# Patient Record
Sex: Male | Born: 1975
Health system: Southern US, Community
[De-identification: ages and names within clinical notes are randomized; demographics above are authoritative.]

## PROBLEM LIST (undated history)

## (undated) DIAGNOSIS — N200 Calculus of kidney: Secondary | ICD-10-CM

---

## 2001-10-18 ENCOUNTER — Inpatient Hospital Stay (HOSPITAL_COMMUNITY): Admission: EM | Admit: 2001-10-18 | Discharge: 2001-10-19 | Payer: Self-pay | Admitting: Psychiatry

## 2008-07-19 ENCOUNTER — Emergency Department (HOSPITAL_COMMUNITY): Admission: EM | Admit: 2008-07-19 | Discharge: 2008-07-19 | Payer: Self-pay | Admitting: Family Medicine

## 2009-04-13 ENCOUNTER — Encounter: Admission: RE | Admit: 2009-04-13 | Discharge: 2009-04-13 | Payer: Self-pay | Admitting: Family Medicine

## 2009-07-20 ENCOUNTER — Ambulatory Visit: Payer: Self-pay | Admitting: Family Medicine
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
## 2011-03-03 NOTE — Discharge Summary (Signed)
[facility]  Patient:    Ronald Wiggins, Ronald Wiggins Visit Number: [id number] MRN: [id number]          Service Type: PSY Location: 300 0301 02 Attending Physician:  Rachael Fee Dictated by:   Reymundo Poll Dub Mikes, M.D. Admit Date:  10/18/2001 Discharge Date: 10/19/2001                             Discharge Summary  CHIEF COMPLAINT AND PRESENT ILLNESS:  This was the first admission to [facility] for this 35 year old male, who was admitted due to increased depression as well as increased alcohol use.  He was admitted on January 3rd.  He used to see [facility] and Dr. Nolen Mu.  His primary physician was handling the medication.  He was given Effexor XR 150 mg per day.  He did admit to occasional use of alcohol, which built up for the last few days before the admission but denies any active use.  Upon admission, she showed to be a well-nourished, well-developed, alert, cooperative male, spontaneous, logical speech, coherent, relevant and goal directed.  His thoughts dealt with the events that led him to be hospitalized, his coping, his losses.  He is wanting to do well and be able to be there for his daughter.  No active suicidal ideation.  No homicidal ideation.  He, upon admission, wanted to be discharged home.  He would be willing to come to the IOP for further stabilization and take time off from work so he can get himself back together.  Cognition was well-preserved.  ADMISSION DIAGNOSES: Axis I:    1. Major depression, recurrent.            2. Panic attacks.            3. Rule out alcohol abuse. Axis II:   No diagnosis. Axis III:  No diagnosis. Axis IV:   Moderate. Axis V:    Global Assessment of Functioning upon admission 45; highest Global            Assessment of Functioning in the last year 65.  HOSPITAL COURSE:  We went ahead and increased the Effexor to 225 mg.  He was given Xanax 0.25 mg on a p.r.n. basis for the next few days  after discharge to transition to outpatient treatment.  He had no suicidal or homicidal ideation. He had a good support system outside of the hospital.  He wanted to be discharged so we went ahead and discharged to outpatient follow-up.  DISCHARGE DIAGNOSES: Axis I:    1. Major depression, recurrent.            2. Panic attacks. Axis II:   No diagnosis. Axis III:  No diagnosis. Axis IV:   Moderate. Axis V:    Global Assessment of Functioning upon discharge 65.  DISCHARGE MEDICATIONS: 1. Effexor XR 225 mg per day. 2. Xanax 0.25 mg twice a day (#14).  FOLLOW-UP:  [facility] and then follow up with Dr. Nolen Mu and [facility]. Dictated by:   Reymundo Poll Dub Mikes, M.D. Attending Physician:  Rachael Fee DD:  11/20/01 TD:  11/21/01 Job: [id number]

## 2011-04-20 ENCOUNTER — Encounter: Payer: Self-pay | Admitting: Family Medicine

## 2011-04-20 ENCOUNTER — Ambulatory Visit (INDEPENDENT_AMBULATORY_CARE_PROVIDER_SITE_OTHER): Payer: PRIVATE HEALTH INSURANCE | Admitting: Family Medicine

## 2011-04-20 VITALS — BP 130/90 | HR 76 | Wt 209.0 lb

## 2011-04-20 DIAGNOSIS — F411 Generalized anxiety disorder: Secondary | ICD-10-CM

## 2011-04-20 MED ORDER — ALPRAZOLAM 0.25 MG PO TABS: 0.2500 mg | ORAL_TABLET | Freq: Three times a day (TID) | ORAL | Status: AC | PRN

## 2011-04-20 NOTE — Patient Instructions (Addendum)
Uses Xanax as needed. Go ahead and experienced all thoughts and feelings you have then  deal with them rather than hold them in

## 2011-04-20 NOTE — Progress Notes (Signed)
  Subjective:    Patient ID: Ronald Wiggins, male    DOB: 09-27-1976, 35 y.o.   MRN: [id number]  HPI He is here for consultation. Since last being seen he did not start on the Cymbalta and did quite well off of it until the last month or so. In that timeframe he has noted increased anxiety, chest tightness, difficulty with sleep as well as occasionally crying. His work is going fairly well. The main issue is the demise of a several month relationship that occurred about the same time. He has had some difficulty with functioning at work and did take today off.   Review of Systems     Objective:   Physical Exam Alert and in no distress with appropriate affect quite tearful.       Assessment & Plan:  Situational anxiety. I will give him Xanax to help control his emotions since he is sometimes having difficulties with functioning at work. I explained that he is going through a grief reaction on top of all of his other life stresses. Discussed dealing with all of his emotions rather than burying them. He did seem to understand this. I will give him Xanax. He continues to have difficulty, further evaluation and counseling will probably be necessary.

## 2011-06-29 ENCOUNTER — Inpatient Hospital Stay (INDEPENDENT_AMBULATORY_CARE_PROVIDER_SITE_OTHER)
Admission: RE | Admit: 2011-06-29 | Discharge: 2011-06-29 | Disposition: A | Discharge status: Home / Self Care | Payer: PRIVATE HEALTH INSURANCE | Source: Ambulatory Visit | Attending: Family Medicine | Admitting: Family Medicine

## 2011-06-29 DIAGNOSIS — N453 Epididymo-orchitis: Secondary | ICD-10-CM

## 2011-06-30 LAB — GC/CHLAMYDIA PROBE AMP, URINE
Chlamydia, Swab/Urine, PCR: NEGATIVE
GC Probe Amp, Urine: NEGATIVE

## 2011-07-18 LAB — DIFFERENTIAL
Basophils Absolute: 0.1
Basophils Relative: 1
Eosinophils Relative: 3
Lymphs Abs: 2.4
Neutro Abs: 5.9
Neutrophils Relative %: 64

## 2011-07-18 LAB — CBC
HCT: 51.2
Hemoglobin: 17.9 — ABNORMAL HIGH
MCV: 94.2
RBC: 5.43
RDW: 14.1
WBC: 9.3

## 2011-07-18 LAB — POCT I-STAT, CHEM 8
BUN: 5 — ABNORMAL LOW
Calcium, Ion: 1.16
Glucose, Bld: 95

## 2013-06-20 ENCOUNTER — Encounter: Payer: Self-pay | Admitting: Family Medicine

## 2013-06-20 ENCOUNTER — Ambulatory Visit (INDEPENDENT_AMBULATORY_CARE_PROVIDER_SITE_OTHER): Payer: PRIVATE HEALTH INSURANCE | Admitting: Family Medicine

## 2013-06-20 VITALS — BP 130/80 | HR 93 | Temp 98.8°F | Wt 250.0 lb

## 2013-06-20 DIAGNOSIS — J029 Acute pharyngitis, unspecified: Secondary | ICD-10-CM

## 2013-06-20 LAB — POCT RAPID STREP A (OFFICE): Rapid Strep A Screen: NEGATIVE

## 2013-06-20 NOTE — Patient Instructions (Signed)
Viral Pharyngitis Viral pharyngitis is a viral infection that produces redness, pain, and swelling (inflammation) of the throat. It can spread from person to person (contagious). CAUSES Viral pharyngitis is caused by inhaling a large amount of certain germs called viruses. Many different viruses cause viral pharyngitis. SYMPTOMS Symptoms of viral pharyngitis include:  Sore throat.  Tiredness.  Stuffy nose.  Low-grade fever.  Congestion.  Cough. TREATMENT Treatment includes rest, drinking plenty of fluids, and the use of over-the-counter medication (approved by your caregiver). HOME CARE INSTRUCTIONS   Drink enough fluids to keep your urine clear or pale yellow.  Eat soft, cold foods such as ice cream, frozen ice pops, or gelatin dessert.  Gargle with warm salt water (1 tsp salt per 1 qt of water).  If over age 7, throat lozenges may be used safely.  Only take over-the-counter or prescription medicines for pain, discomfort, or fever as directed by your caregiver. Do not take aspirin. To help prevent spreading viral pharyngitis to others, avoid:  Mouth-to-mouth contact with others.  Sharing utensils for eating and drinking.  Coughing around others. SEEK MEDICAL CARE IF:   You are better in a few days, then become worse.  You have a fever or pain not helped by pain medicines.  There are any other changes that concern you. Document Released: 07/12/2005 Document Revised: 12/25/2011 Document Reviewed: 12/08/2010 ExitCare Patient Information 2014 [employer].  

## 2013-06-20 NOTE — Progress Notes (Signed)
  Subjective:    Patient ID: Ronald Wiggins, male    DOB: 06/13/1976, 37 y.o.   MRN: [id number]  HPI He has a three-day history of sore throat, dry cough but no fever, chills or earache  Review of Systems     Objective:   Physical Exam alert and in no distress. Tympanic membranes and canals are normal. Throat is clear. Tonsils are normal. Neck is supple without adenopathy or thyromegaly. Cardiac exam shows a regular sinus rhythm without murmurs or gallops. Lungs are clear to auscultation.        Assessment & Plan:

## 2013-12-25 ENCOUNTER — Encounter: Payer: Self-pay | Admitting: Family Medicine

## 2013-12-25 ENCOUNTER — Telehealth: Payer: Self-pay | Admitting: Internal Medicine

## 2013-12-25 ENCOUNTER — Ambulatory Visit (INDEPENDENT_AMBULATORY_CARE_PROVIDER_SITE_OTHER): Payer: PRIVATE HEALTH INSURANCE | Admitting: Family Medicine

## 2013-12-25 VITALS — BP 130/94 | HR 80 | Temp 98.5°F | Wt 254.0 lb

## 2013-12-25 DIAGNOSIS — J019 Acute sinusitis, unspecified: Secondary | ICD-10-CM

## 2013-12-25 MED ORDER — AMOXICILLIN 875 MG PO TABS: 875.0000 mg | ORAL_TABLET | Freq: Two times a day (BID) | ORAL | Status: DC

## 2013-12-25 NOTE — Telephone Encounter (Signed)
Pt coming in today at 2:15

## 2013-12-25 NOTE — Progress Notes (Signed)
   Subjective:    Patient ID: Ronald Wiggins, male    DOB: [birth year], 38 y.o.   MRN: [id number]  HPI He complains of a 7 day history this started with nasal congestion, sore throat, sneezing, watery eyes. He has been on Claritin for this. In the last several days the symptoms have worsened with associated sinus pressure especially on the left with headache and upper tooth discomfort with fatigue malaise and some chest tightness. No fever, chills, ear pressure. He does not smoke.   Review of Systems     Objective:   Physical Exam alert and in no distress. Tympanic membranes and canals are normal. Throat is clear. Tonsils are normal. Neck is supple without adenopathy or thyromegaly. Cardiac exam shows a regular sinus rhythm without murmurs or gallops. Lungs are clear to auscultation. He does have tenderness palpation over frontal and maxillary sinuses left greater than right        Assessment & Plan:  Acute sinusitis - Plan: amoxicillin (AMOXIL) 875 MG tablet  is to call if not entirely better when he finishes the course of the antibiotic.

## 2013-12-25 NOTE — Telephone Encounter (Signed)
Let him know that he will need an appointment selected determine the best course of therapy for him

## 2013-12-25 NOTE — Telephone Encounter (Signed)
Pt states that he is having sinus issues, his mucous is green/yellow and he is congested, has a headache and has ear popping. [facility] would [facility]

## 2013-12-25 NOTE — Patient Instructions (Addendum)
Take all the antibiotic and if not totally back to normal when you finish call me Add Afrin but only at night and continue on the Advil cold and sinus

## 2013-12-26 ENCOUNTER — Ambulatory Visit: Payer: PRIVATE HEALTH INSURANCE | Admitting: Family Medicine

## 2014-07-14 ENCOUNTER — Encounter (HOSPITAL_COMMUNITY): Payer: Self-pay | Admitting: Emergency Medicine

## 2014-07-14 ENCOUNTER — Emergency Department (HOSPITAL_COMMUNITY)
Admission: EM | Admit: 2014-07-14 | Discharge: 2014-07-14 | Discharge status: Against Medical Advice | Payer: No Typology Code available for payment source | Attending: Emergency Medicine | Admitting: Emergency Medicine

## 2014-07-14 ENCOUNTER — Emergency Department (HOSPITAL_COMMUNITY): Payer: No Typology Code available for payment source

## 2014-07-14 DIAGNOSIS — Z87891 Personal history of nicotine dependence: Secondary | ICD-10-CM | POA: Diagnosis not present

## 2014-07-14 DIAGNOSIS — R109 Unspecified abdominal pain: Secondary | ICD-10-CM | POA: Diagnosis present

## 2014-07-14 DIAGNOSIS — E119 Type 2 diabetes mellitus without complications: Secondary | ICD-10-CM

## 2014-07-14 DIAGNOSIS — R739 Hyperglycemia, unspecified: Secondary | ICD-10-CM

## 2014-07-14 DIAGNOSIS — Z87442 Personal history of urinary calculi: Secondary | ICD-10-CM | POA: Insufficient documentation

## 2014-07-14 DIAGNOSIS — N509 Disorder of male genital organs, unspecified: Secondary | ICD-10-CM | POA: Insufficient documentation

## 2014-07-14 HISTORY — DX: Calculus of kidney: N20.0

## 2014-07-14 LAB — CBG MONITORING, ED: GLUCOSE-CAPILLARY: 272 mg/dL — AB (ref 70–99)

## 2014-07-14 LAB — URINALYSIS, ROUTINE W REFLEX MICROSCOPIC
Bilirubin Urine: NEGATIVE
Glucose, UA: >1000 mg/dL — AB
Hgb urine dipstick: NEGATIVE
Ketones, ur: NEGATIVE mg/dL
Leukocytes, UA: NEGATIVE
NITRITE: NEGATIVE
Protein, ur: NEGATIVE mg/dL
Specific Gravity, Urine: 1.046 — ABNORMAL HIGH (ref 1.005–1.030)
UROBILINOGEN UA: 0.2 mg/dL (ref 0.0–1.0)
pH: 5.5 (ref 5.0–8.0)

## 2014-07-14 LAB — COMPREHENSIVE METABOLIC PANEL
ALK PHOS: 75 U/L (ref 39–117)
ALT: 39 U/L (ref 0–53)
AST: 36 U/L (ref 0–37)
Albumin: 4.3 g/dL (ref 3.5–5.2)
Anion gap: 17 — ABNORMAL HIGH (ref 5–15)
BILIRUBIN TOTAL: 1 mg/dL (ref 0.3–1.2)
BUN: 10 mg/dL (ref 6–23)
CHLORIDE: 97 meq/L (ref 96–112)
CO2: 21 mEq/L (ref 19–32)
CREATININE: 0.49 mg/dL — AB (ref 0.50–1.35)
Calcium: 9.3 mg/dL (ref 8.4–10.5)
GLUCOSE: 355 mg/dL — AB (ref 70–99)
Potassium: 4.5 mEq/L (ref 3.7–5.3)
Sodium: 135 mEq/L — ABNORMAL LOW (ref 137–147)
Total Protein: 7.8 g/dL (ref 6.0–8.3)

## 2014-07-14 LAB — CBC
HCT: 46.1 % (ref 39.0–52.0)
HEMOGLOBIN: 16.1 g/dL (ref 13.0–17.0)
MCH: 30.3 pg (ref 26.0–34.0)
MCHC: 34.9 g/dL (ref 30.0–36.0)
MCV: 86.7 fL (ref 78.0–100.0)
Platelets: 133 10*3/uL — ABNORMAL LOW (ref 150–400)
RBC: 5.32 MIL/uL (ref 4.22–5.81)
RDW: 13.1 % (ref 11.5–15.5)
WBC: 6.2 10*3/uL (ref 4.0–10.5)

## 2014-07-14 LAB — URINE MICROSCOPIC-ADD ON

## 2014-07-14 MED ORDER — SODIUM CHLORIDE 0.9 % IV BOLUS (SEPSIS)
1000.0000 mL | Freq: Once | INTRAVENOUS | Status: AC
  Administered 2014-07-14: 1000 mL via INTRAVENOUS

## 2014-07-14 MED ORDER — MORPHINE SULFATE 4 MG/ML IJ SOLN
4.0000 mg | Freq: Once | INTRAMUSCULAR | Status: AC
  Administered 2014-07-14: 4 mg via INTRAVENOUS
  Filled 2014-07-14: qty 1

## 2014-07-14 MED ORDER — KETOROLAC TROMETHAMINE 30 MG/ML IJ SOLN
30.0000 mg | Freq: Once | INTRAMUSCULAR | Status: AC
  Administered 2014-07-14: 30 mg via INTRAVENOUS
  Filled 2014-07-14: qty 1

## 2014-07-14 MED ORDER — HYDROCODONE-ACETAMINOPHEN 5-325 MG PO TABS: 1.0000 | ORAL_TABLET | Freq: Four times a day (QID) | ORAL | Status: DC | PRN

## 2014-07-14 MED ORDER — TAMSULOSIN HCL 0.4 MG PO CAPS
0.4000 mg | ORAL_CAPSULE | Freq: Once | ORAL | Status: AC
  Administered 2014-07-14: 0.4 mg via ORAL
  Filled 2014-07-14: qty 1

## 2014-07-14 NOTE — ED Notes (Signed)
Patient has refused blood draw for labs.RN made aware 

## 2014-07-14 NOTE — Discharge Instructions (Signed)
Diabetes Mellitus and Food °It is important for you to manage your blood sugar (glucose) level. Your blood glucose level can be greatly affected by what you eat. Eating healthier foods in the appropriate amounts throughout the day at about the same time each day will help you control your blood glucose level. It can also help slow or prevent worsening of your diabetes mellitus. Healthy eating may even help you improve the level of your blood pressure and reach or maintain a healthy weight.  °HOW CAN FOOD AFFECT ME? °Carbohydrates °Carbohydrates affect your blood glucose level more than any other type of food. Your dietitian will help you determine how many carbohydrates to eat at each meal and teach you how to count carbohydrates. Counting carbohydrates is important to keep your blood glucose at a healthy level, especially if you are using insulin or taking certain medicines for diabetes mellitus. °Alcohol °Alcohol can cause sudden decreases in blood glucose (hypoglycemia), especially if you use insulin or take certain medicines for diabetes mellitus. Hypoglycemia can be a life-threatening condition. Symptoms of hypoglycemia (sleepiness, dizziness, and disorientation) are similar to symptoms of having too much alcohol.  °If your health care provider has given you approval to drink alcohol, do so in moderation and use the following guidelines: °· Women should not have more than one drink per day, and men should not have more than two drinks per day. One drink is equal to: °¨ 12 oz of beer. °¨ 5 oz of wine. °¨ 1½ oz of hard liquor. °· Do not drink on an empty stomach. °· Keep yourself hydrated. Have water, diet soda, or unsweetened iced tea. °· Regular soda, juice, and other mixers might contain a lot of carbohydrates and should be counted. °WHAT FOODS ARE NOT RECOMMENDED? °As you make food choices, it is important to remember that all foods are not the same. Some foods have fewer nutrients per serving than other  foods, even though they might have the same number of calories or carbohydrates. It is difficult to get your body what it needs when you eat foods with fewer nutrients. Examples of foods that you should avoid that are high in calories and carbohydrates but low in nutrients include: °· Trans fats (most processed foods list trans fats on the Nutrition Facts label). °· Regular soda. °· Juice. °· Candy. °· Sweets, such as cake, pie, doughnuts, and cookies. °· Fried foods. °WHAT FOODS CAN I EAT? °Have nutrient-rich foods, which will nourish your body and keep you healthy. The food you should eat also will depend on several factors, including: °· The calories you need. °· The medicines you take. °· Your weight. °· Your blood glucose level. °· Your blood pressure level. °· Your cholesterol level. °You also should eat a variety of foods, including: °· Protein, such as meat, poultry, fish, tofu, nuts, and seeds (lean animal proteins are best). °· Fruits. °· Vegetables. °· Dairy products, such as milk, cheese, and yogurt (low fat is best). °· Breads, grains, pasta, cereal, rice, and beans. °· Fats such as olive oil, trans fat-free margarine, canola oil, avocado, and olives. °DOES EVERYONE WITH DIABETES MELLITUS HAVE THE SAME MEAL PLAN? °Because every person with diabetes mellitus is different, there is not one meal plan that works for everyone. It is very important that you meet with a dietitian who will help you create a meal plan that is just right for you. °Document Released: 06/29/2005 Document Revised: 10/07/2013 Document Reviewed: 08/29/2013 °ExitCare® Patient Information ©2015 [employer]. This   information is not intended to replace advice given to you by your health care provider. Make sure you discuss any questions you have with your health care provider.   Flank Pain Flank pain refers to pain that is located on the side of the body between the upper abdomen and the back. The pain may occur over a short period  of time (acute) or may be long-term or reoccurring (chronic). It may be mild or severe. Flank pain can be caused by many things. CAUSES  Some of the more common causes of flank pain include:  Muscle strains.   Muscle spasms.   A disease of your spine (vertebral disk disease).   A lung infection (pneumonia).   Fluid around your lungs (pulmonary edema).   A kidney infection.   Kidney stones.   A very painful skin rash caused by the chickenpox virus (shingles).   Gallbladder disease.  HOME CARE INSTRUCTIONS  Home care will depend on the cause of your pain. In general,  Rest as directed by your caregiver.  Drink enough fluids to keep your urine clear or pale yellow.  Only take over-the-counter or prescription medicines as directed by your caregiver. Some medicines may help relieve the pain.  Tell your caregiver about any changes in your pain.  Follow up with your caregiver as directed. SEEK IMMEDIATE MEDICAL CARE IF:   Your pain is not controlled with medicine.   You have new or worsening symptoms.  Your pain increases.   You have abdominal pain.   You have shortness of breath.   You have persistent nausea or vomiting.   You have swelling in your abdomen.   You feel faint or pass out.   You have blood in your urine.  You have a fever or persistent symptoms for more than 2-3 days.  You have a fever and your symptoms suddenly get worse. MAKE SURE YOU:   Understand these instructions.  Will watch your condition.  Will get help right away if you are not doing well or get worse. Document Released: 11/23/2005 Document Revised: 06/26/2012 Document Reviewed: 05/16/2012 [facility] Patient Information 2015 [place], [place]. This information is not intended to replace advice given to you by your health care provider. Make sure you discuss any questions you have with your health care provider.

## 2014-07-14 NOTE — ED Notes (Signed)
Pt c/o flank pain radiating to left groin, onset this morning. Pt states has history of kidney stones and believes it is a kidney stone. Pt denies any urinary problems. Tender to LLQ upon assessment. Denies n/v/d or shortness of breath.

## 2014-07-14 NOTE — ED Notes (Signed)
Patient c/o left flank pain tht is radiating into the left groin area. Patient has a history of kidney stones. Patient denies any blood in his urine.

## 2014-07-14 NOTE — ED Provider Notes (Signed)
CSN: [id number]     Arrival date & time 07/14/14  1419 History   First MD Initiated Contact with Patient 07/14/14 1543     Chief Complaint  Patient presents with  . Flank Pain     (Consider location/radiation/quality/duration/timing/severity/associated sxs/prior Treatment) Patient is a 38 y.o. male presenting with flank pain. The history is provided by the patient. No language interpreter was used.  Flank Pain This is a new problem. The current episode started 6 to 12 hours ago. The problem occurs constantly. Progression since onset: waxing/waning. Associated symptoms include abdominal pain. Pertinent negatives include no chest pain, no headaches and no shortness of breath. Associated symptoms comments: L testicular pain. Nothing aggravates the symptoms. Nothing relieves the symptoms. He has tried nothing for the symptoms. The treatment provided no relief.    Past Medical History  Diagnosis Date  . Kidney stones    History reviewed. No pertinent past surgical history. Family History  Problem Relation Age of Onset  . Urolithiasis Sister   . Urolithiasis Brother    History  Substance Use Topics  . Smoking status: Former Smoker -- 0.10 packs/day    Types: Cigarettes  . Smokeless tobacco: Never Used  . Alcohol Use: Yes     Comment: occasionally    Review of Systems  Constitutional: Negative for fever, activity change, appetite change and fatigue.  HENT: Negative for congestion, facial swelling, rhinorrhea and trouble swallowing.   Eyes: Negative for photophobia and pain.  Respiratory: Negative for cough, chest tightness and shortness of breath.   Cardiovascular: Negative for chest pain and leg swelling.  Gastrointestinal: Positive for abdominal pain. Negative for nausea, vomiting, diarrhea and constipation.  Endocrine: Negative for polydipsia and polyuria.  Genitourinary: Positive for flank pain and testicular pain. Negative for dysuria, urgency, frequency, hematuria, decreased  urine volume, penile swelling, scrotal swelling, difficulty urinating and penile pain.  Musculoskeletal: Negative for back pain and gait problem.  Skin: Negative for color change, rash and wound.  Allergic/Immunologic: Negative for immunocompromised state.  Neurological: Negative for dizziness, facial asymmetry, speech difficulty, weakness, numbness and headaches.  Psychiatric/Behavioral: Negative for confusion, decreased concentration and agitation.      Allergies  Review of patient's allergies indicates no known allergies.  Home Medications   Prior to Admission medications   Medication Sig Start Date End Date Taking? Authorizing Provider  naproxen sodium (ANAPROX) 220 MG tablet Take 440 mg by mouth every 8 (eight) hours as needed (pain).   Yes Historical Provider, MD  HYDROcodone-acetaminophen (NORCO) 5-325 MG per tablet Take 1-2 tablets by mouth every 6 (six) hours as needed. 07/14/14   Toy CookeyMegan Ranae Casebier, MD   BP 142/81  Pulse 86  Temp(Src) 98.9 F (37.2 C) (Oral)  Resp 16  SpO2 98% Physical Exam  Constitutional: He is oriented to person, place, and time. He appears well-developed and well-nourished. No distress.  HENT:  Head: Normocephalic and atraumatic.  Mouth/Throat: No oropharyngeal exudate.  Eyes: Pupils are equal, round, and reactive to light.  Neck: Normal range of motion. Neck supple.  Cardiovascular: Normal rate, regular rhythm and normal heart sounds.  Exam reveals no gallop and no friction rub.   No murmur heard. Pulmonary/Chest: Effort normal and breath sounds normal. No respiratory distress. He has no wheezes. He has no rales.  Abdominal: Soft. Bowel sounds are normal. He exhibits no distension and no mass. There is no tenderness. There is no rebound and no guarding. Hernia confirmed negative in the right inguinal area and confirmed negative in the  left inguinal area.  Genitourinary: Testes normal. Right testis shows no mass, no swelling and no tenderness. Right  testis is descended. Cremasteric reflex is not absent on the right side. Left testis shows no mass, no swelling and no tenderness. Left testis is descended. Cremasteric reflex is not absent on the left side. Circumcised.  Musculoskeletal: Normal range of motion. He exhibits no edema and no tenderness.  Neurological: He is alert and oriented to person, place, and time.  Skin: Skin is warm and dry.  Psychiatric: He has a normal mood and affect.    ED Course  Procedures (including critical care time) Labs Review Labs Reviewed  CBC - Abnormal; Notable for the following:    Platelets 133 (*)    All other components within normal limits  URINALYSIS, ROUTINE W REFLEX MICROSCOPIC - Abnormal; Notable for the following:    Specific Gravity, Urine 1.046 (*)    Glucose, UA >1000 (*)    All other components within normal limits  COMPREHENSIVE METABOLIC PANEL - Abnormal; Notable for the following:    Sodium 135 (*)    Glucose, Bld 355 (*)    Creatinine, Ser 0.49 (*)    Anion gap 17 (*)    All other components within normal limits  CBG MONITORING, ED - Abnormal; Notable for the following:    Glucose-Capillary 272 (*)    All other components within normal limits  URINE MICROSCOPIC-ADD ON    Imaging Review Ct Renal Stone Study  07/14/2014   CLINICAL DATA:  Left flank pain.  History kidney stones.  EXAM: CT ABDOMEN AND PELVIS WITHOUT CONTRAST  TECHNIQUE: Multidetector CT imaging of the abdomen and pelvis was performed following the standard protocol without IV contrast.  COMPARISON:  08/01/2011  FINDINGS: Mild respiratory motion artifact in the upper abdomen.  Lower chest: Clear lung bases. Normal heart size, without pericardial or pleural effusion.  Hepatobiliary: Mild hepatic steatosis with sparing adjacent the gallbladder. Hepatomegaly, 21.9 cm craniocaudal. Normal gallbladder, without biliary ductal dilatation.  Pancreas: Normal, without mass or pancreatic ductal dilatation.  Spleen: Splenomegaly,  with the spleen measuring 13.1 x 13.6 x 9.3 cm. Mild.  Adrenals/Urinary Tract: Normal adrenal glands. Favor concentrated urine in the right upper pole renal medulla on image 30. Punctate stone felt less likely. No left-sided renal calculi. No hydronephrosis. No hydroureter or ureteric calculi. No bladder calculi.  Stomach/Bowel: Normal stomach, without wall thickening. Colonic stool burden suggests constipation. Normal terminal ileum and appendix. Normal small bowel without abdominal ascites.  Vascular/Lymphatic: No aneurysm. No abdominopelvic adenopathy.  Reproductive: Normal prostate. Small left hydrocele, incompletely imaged.  Other:  No significant free fluid.  Musculoskeletal: Congenitally short lumbar pedicles. Disc bulge at the lumbosacral junction.  IMPRESSION: 1. No urinary tract obstruction or ureteric stone. No urinary tract explanation for left-sided pain. 2. Favor concentrated urine in the upper pole right kidney. Punctate collecting system stone cannot be excluded. 3. Hepatic steatosis and hepatosplenomegaly. 4.  Possible constipation.   Electronically Signed   By: Jeronimo Greaves M.D.   On: 07/14/2014 18:47     EKG Interpretation None      MDM   Final diagnoses:  Left flank pain  Hyperglycemia  New onset type 2 diabetes mellitus    Pt is a 38 y.o. male with Pmhx as above who presents with sudden onset L flank pain at about 7:30 AM with radiation to L testicle. Pain waxing/waning. No fever, chills, nausea, vomiting, dysuria or hematuria. This is similar to prior kidney stones including  radiation to groin. He's never had to have surgical intervention for kidneys and in the past. On physical exam vital signs are stable he is in no acute distress. He has left CVA tenderness. GU exam is unremarkable and he is no reproducible tenderness to left testicle. I believe presentation is c/w kidney stone. Doubt testicular torsion given PE  Urinalysis normal and without blood but did have large amount  of glucose. CBC with glucose of 355, anion gap of 17. WBC normal. Creatinine normal. CT stone study added which showed no acute left-sided findings. He does have hepatic steatosis and hepatosplenomegaly. 2 L of normal saline were given and his glucose was repeated at 272. I discussed with them this elevated glucose; surgeon that he likely has new onset diabetes. Plan was to repeat BMP to look for improvement of his anion gap and glucose. He does not want to stay for this. I have explained to him that if these labs are abnormal he could have DKA which could be life threatening. He continues to have left-sided flank pain. I have discussed NextStep would be to get a testicular ultrasound. He is also refuses. He was signed out against medical device. He and his wife agreed to return to the ED if symptoms worsen in to followup very closely later this week with his primary doctor. I believe he will need to be started on medications for diabetes in the office this week.         Toy Cookey, MD 07/15/14 [phone]

## 2014-07-14 NOTE — ED Notes (Signed)
Unsuccessful attempt to place peripheral IV. Attempt x2.

## 2014-07-14 NOTE — ED Notes (Signed)
Pt left AMA Ronald Wiggins aware

## 2014-07-16 ENCOUNTER — Ambulatory Visit (INDEPENDENT_AMBULATORY_CARE_PROVIDER_SITE_OTHER): Payer: PRIVATE HEALTH INSURANCE | Admitting: Family Medicine

## 2014-07-16 ENCOUNTER — Encounter: Payer: Self-pay | Admitting: Family Medicine

## 2014-07-16 VITALS — BP 130/90 | HR 81 | Ht 68.0 in | Wt 236.0 lb

## 2014-07-16 DIAGNOSIS — E118 Type 2 diabetes mellitus with unspecified complications: Secondary | ICD-10-CM | POA: Insufficient documentation

## 2014-07-16 DIAGNOSIS — M461 Sacroiliitis, not elsewhere classified: Secondary | ICD-10-CM

## 2014-07-16 DIAGNOSIS — E669 Obesity, unspecified: Secondary | ICD-10-CM

## 2014-07-16 DIAGNOSIS — E119 Type 2 diabetes mellitus without complications: Secondary | ICD-10-CM

## 2014-07-16 LAB — LIPID PANEL
CHOL/HDL RATIO: 6.4 ratio
CHOLESTEROL: 187 mg/dL (ref 0–200)
HDL: 29 mg/dL — AB (ref >39)
TRIGLYCERIDES: 631 mg/dL — AB (ref <150)

## 2014-07-16 MED ORDER — METFORMIN HCL ER (MOD) 1000 MG PO TB24: 1000.0000 mg | ORAL_TABLET | Freq: Every day | ORAL | Status: DC

## 2014-07-16 MED ORDER — GLIPIZIDE 5 MG PO TABS: 5.0000 mg | ORAL_TABLET | Freq: Two times a day (BID) | ORAL | Status: DC

## 2014-07-16 NOTE — Progress Notes (Signed)
   Subjective:    Patient ID: Ronald Wiggins, male    DOB: Feb 15, 1976, 38 y.o.   MRN: [id number]  HPI He is here for consult concerning recent trip to the emergency room. He went in for evaluation of back pain. During the encounter, his blood sugar was noted to be elevated. He now states that he has had polyuria, polydipsia and weight loss for the last 2 months. The original tip was for left-sided pain. ER record was reviewed and indicated more of a flank pain but now he states it's more low back.   Review of Systems     Objective:   Physical Exam Alert and in no distress. Tender to palpation over the left SI joint. Provocative testing positive. Hip motion normal. Straight leg raising negative.       Assessment & Plan:  Sacroiliitis  New onset type 2 diabetes mellitus - Plan: POCT glycosylated hemoglobin (Hb A1C), Lipid panel, glipiZIDE (GLUCOTROL) 5 MG tablet, metFORMIN (GLUMETZA) 1000 MG (MOD) 24 hr tablet  Obesity (BMI 30-39.9)  recommend heat and stretching as well as anti-inflammatory for his back. Discussed in detail the diagnosis of diabetes in regard to diet, exercise, weight loss, medications. We also discussed the damage from diabetes in regard to neurological, ophthalmology, cardiac renal and neurologic problems. Recommend he go to the American diabetes association website.. I will start him on metformin and glipizide. Discussed possible side effects of these. Return here in 2 weeks. Over half an hour spent discussing all this with him.

## 2014-07-16 NOTE — Patient Instructions (Signed)
Heat for 20 minutes 3 times per day. Do stretching after that and take your favorite pain med

## 2014-08-05 ENCOUNTER — Telehealth: Payer: Self-pay | Admitting: Family Medicine

## 2014-08-06 NOTE — Telephone Encounter (Signed)
Left pt word for word message 

## 2014-08-06 NOTE — Telephone Encounter (Signed)
Have him set up an appointment for next week and keep track of his blood sugars and keep track of symptoms in regard to taking his medications.

## 2014-08-06 NOTE — Telephone Encounter (Signed)
Check with him on what his blood sugars have been running. He might need to be seen.

## 2014-08-06 NOTE — Telephone Encounter (Signed)
Patient states that he blood sugar has been running 100 to 140 the last time it happened was yesterday he checked his B/S before he ate lunch it was 106 after he finished is when the sweats and shakes happen he states is happens usually after he eats but not all the time please advise

## 2014-08-11 ENCOUNTER — Other Ambulatory Visit: Payer: Self-pay

## 2014-08-11 ENCOUNTER — Telehealth: Payer: Self-pay | Admitting: Family Medicine

## 2014-08-11 MED ORDER — GLUCOSE BLOOD VI STRP: ORAL_STRIP | Status: DC

## 2014-08-11 NOTE — Telephone Encounter (Signed)
Please take care of this.  

## 2014-08-11 NOTE — Telephone Encounter (Signed)
Need rx for test strips   and lancets sent to [facility]  Machine type he is not sure of exact machine type  One touch  ( he forgot from our office)

## 2014-08-11 NOTE — Telephone Encounter (Signed)
done

## 2014-11-30 ENCOUNTER — Other Ambulatory Visit: Payer: Self-pay

## 2014-11-30 ENCOUNTER — Telehealth: Payer: Self-pay | Admitting: Internal Medicine

## 2014-11-30 DIAGNOSIS — E119 Type 2 diabetes mellitus without complications: Secondary | ICD-10-CM

## 2014-11-30 MED ORDER — GLIPIZIDE 5 MG PO TABS: 5.0000 mg | ORAL_TABLET | Freq: Two times a day (BID) | ORAL | Status: DC

## 2014-11-30 NOTE — Telephone Encounter (Signed)
Refill request for glipizide 5mg  #60 to [facility] [address]

## 2014-12-19 IMAGING — CT CT RENAL STONE PROTOCOL
1 series · 15 of 23 positions shown, 19 images · non-contrast
Comparison: 08/01/2011

CLINICAL DATA: Left flank pain.  History kidney stones.

EXAM:
CT ABDOMEN AND PELVIS WITHOUT CONTRAST
TECHNIQUE: Multidetector CT imaging of the abdomen and pelvis was performed
following the standard protocol without IV contrast.

[Series 4: lung · axial · 0.74mm/px · z∈[-14,+86]mm · 15 of 23 slices shown, 19 images]
[im 2/23  soft-tissue]
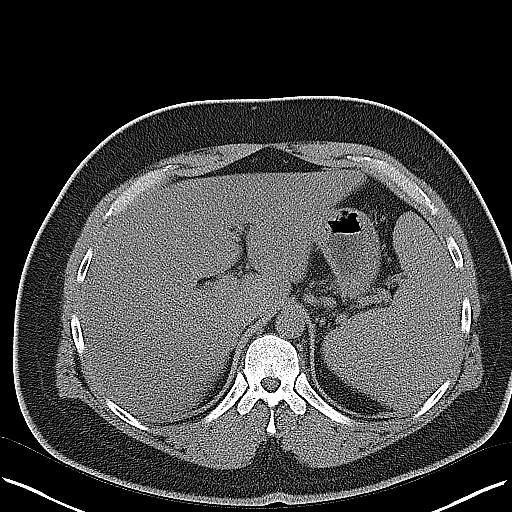
[im 2/23  bone]
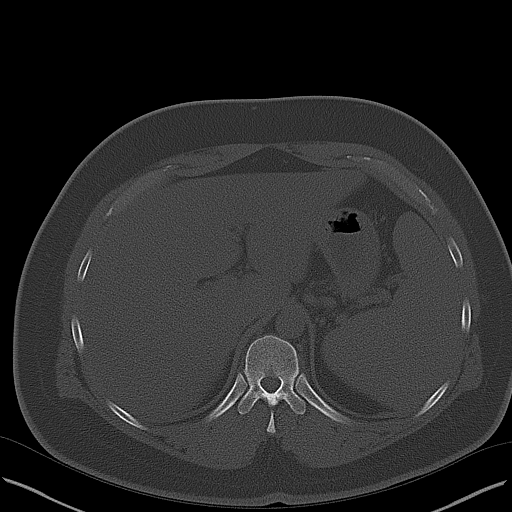
[im 4/23  soft-tissue]
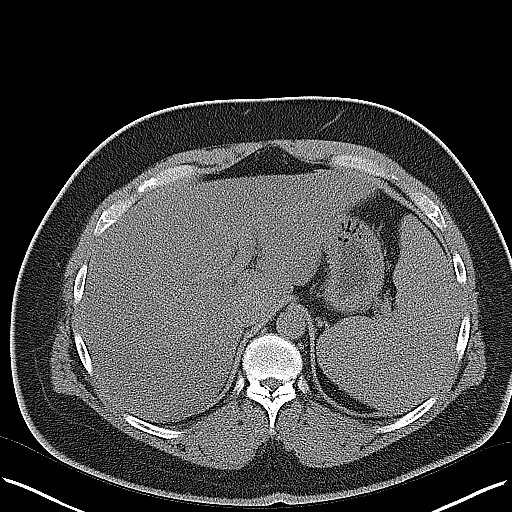
[im 6/23  soft-tissue]
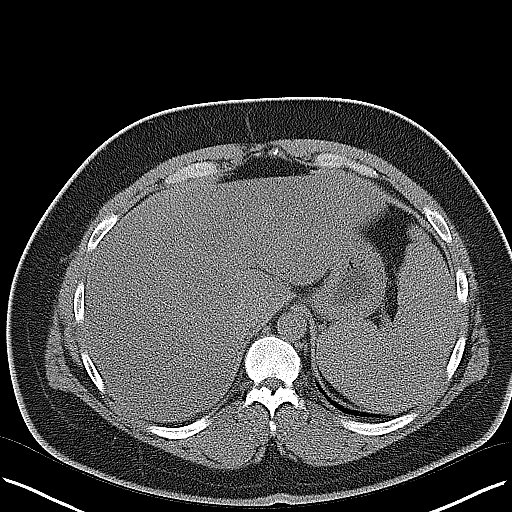
[im 7/23  soft-tissue]
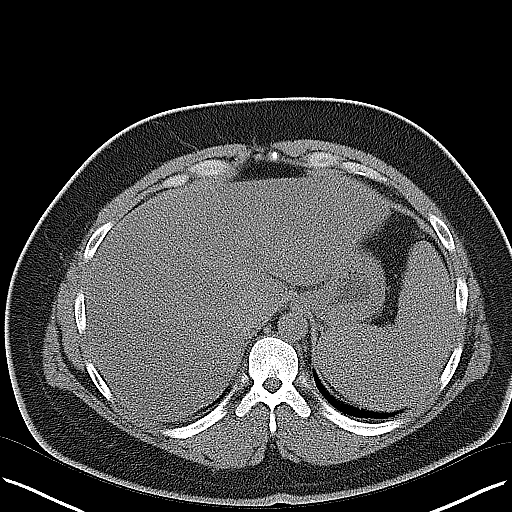
[im 9/23  soft-tissue]
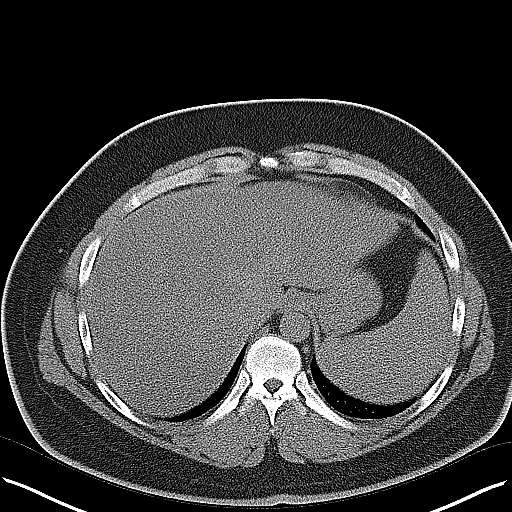
[im 10/23  soft-tissue]
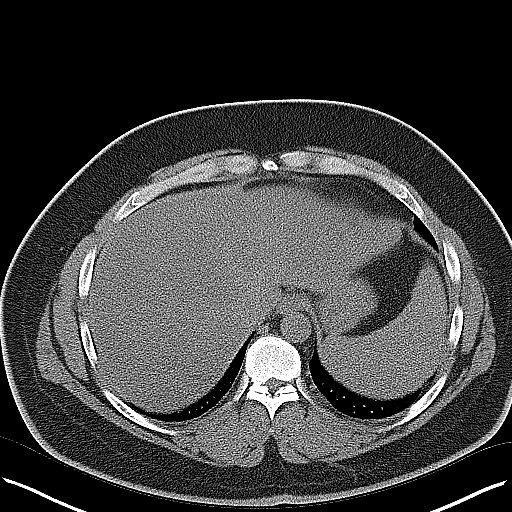
[im 12/23  soft-tissue]
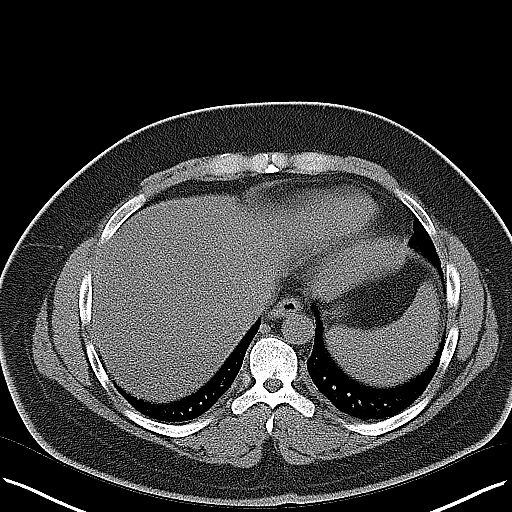
[im 14/23  soft-tissue]
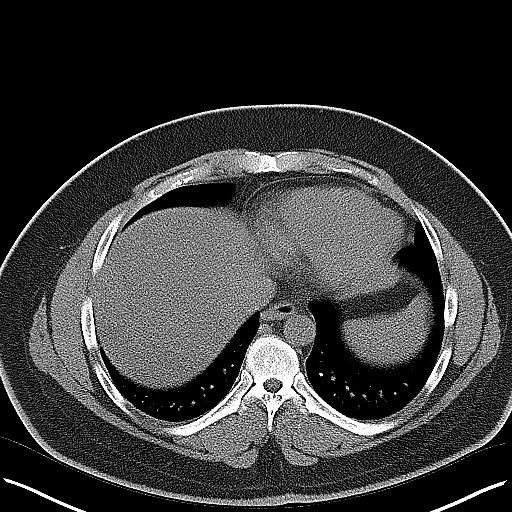
[im 15/23  soft-tissue]
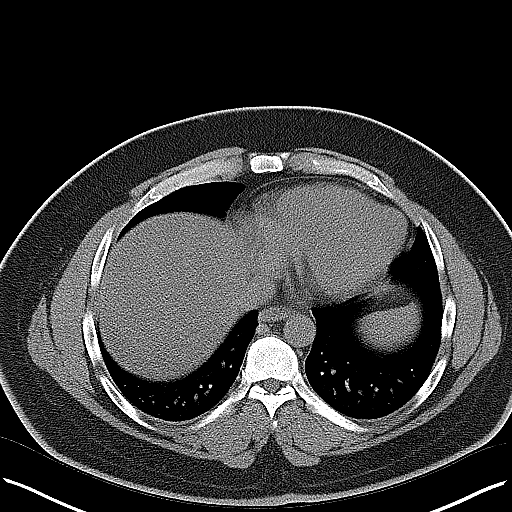
[im 15/23  bone]
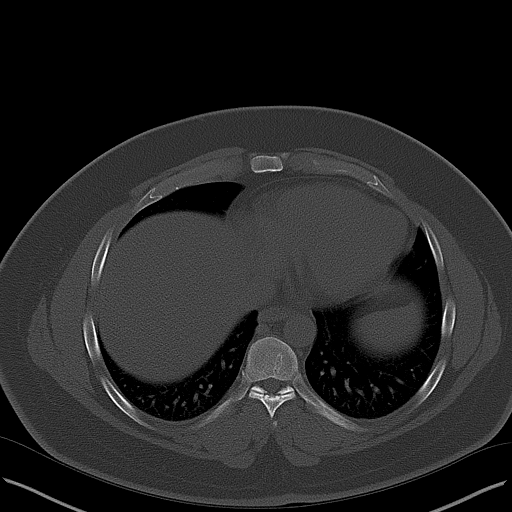
[im 17/23  soft-tissue]
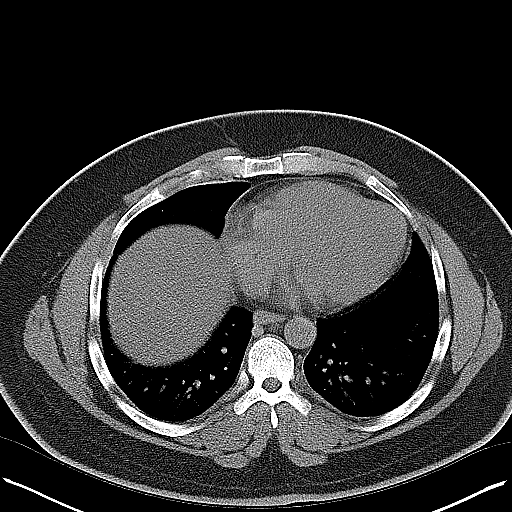
[im 18/23  soft-tissue]
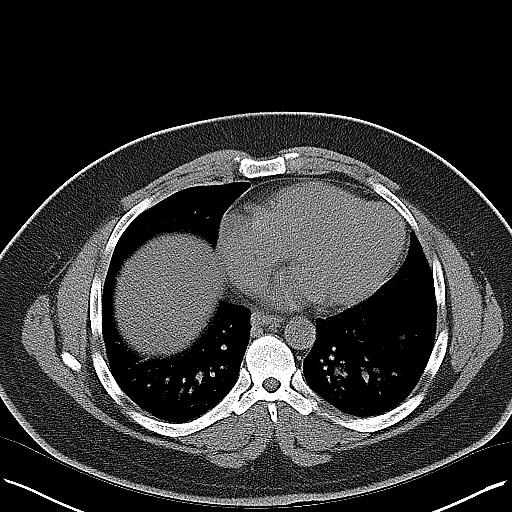
[im 19/23  lung]
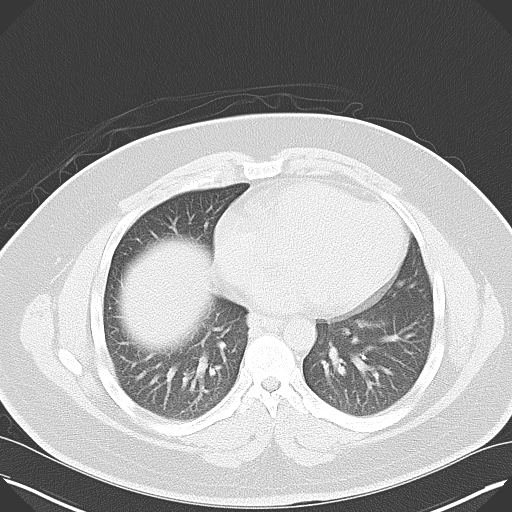
[im 20/23  soft-tissue]
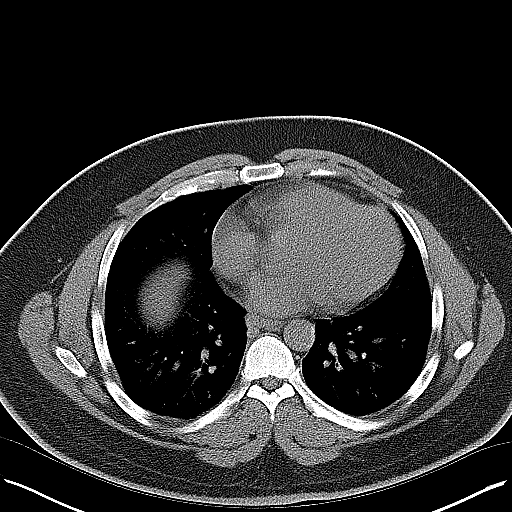
[im 20/23  lung]
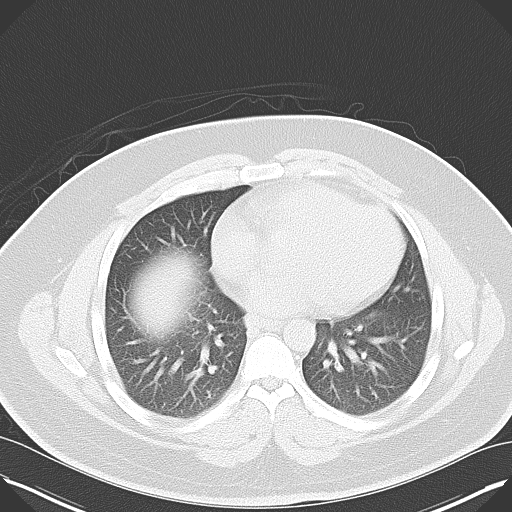
[im 21/23  lung]
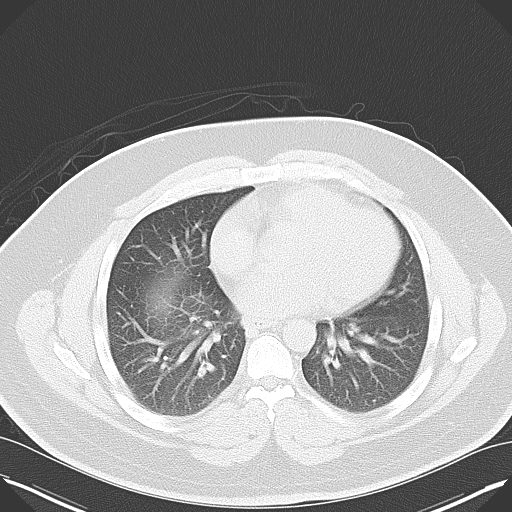
[im 22/23  soft-tissue]
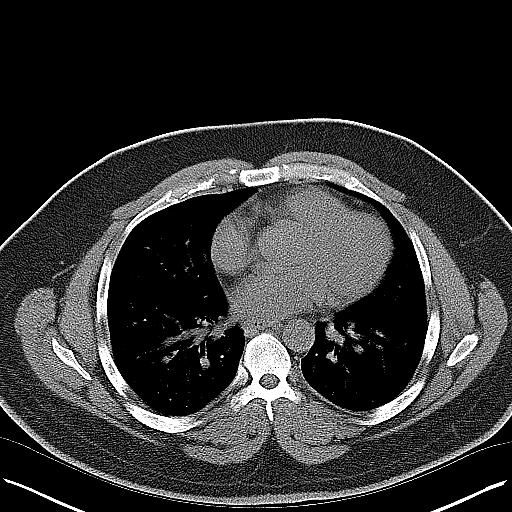
[im 22/23  lung]
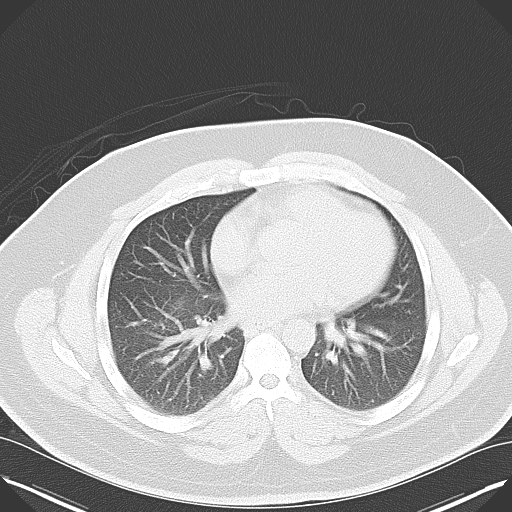

[15 of 23 positions shown; findings below may reference images not displayed]

FINDINGS: Mild respiratory motion artifact in the upper abdomen.

Lower chest: Clear lung bases. Normal heart size, without
pericardial or pleural effusion.

Hepatobiliary: Mild hepatic steatosis with sparing adjacent the
gallbladder. Hepatomegaly, 21.9 cm craniocaudal. Normal gallbladder,
without biliary ductal dilatation.

Pancreas: Normal, without mass or pancreatic ductal dilatation.

Spleen: Splenomegaly, with the spleen measuring 13.1 x 13.6 x
cm. Mild.

Adrenals/Urinary Tract: Normal adrenal glands. Favor concentrated
urine in the right upper pole renal medulla on image 30. Punctate
stone felt less likely. No left-sided renal calculi. No
hydronephrosis. No hydroureter or ureteric calculi. No bladder
calculi.

Stomach/Bowel: Normal stomach, without wall thickening. Colonic
stool burden suggests constipation. Normal terminal ileum and
appendix. Normal small bowel without abdominal ascites.

Vascular/Lymphatic: No aneurysm. No abdominopelvic adenopathy.

Reproductive: Normal prostate. Small left hydrocele, incompletely
imaged.

Other:  No significant free fluid.

Musculoskeletal: Congenitally short lumbar pedicles. Disc bulge at
the lumbosacral junction.
IMPRESSION: 1. No urinary tract obstruction or ureteric stone. No urinary tract
explanation for left-sided pain.
2. Favor concentrated urine in the upper pole right kidney. Punctate
collecting system stone cannot be excluded.
3. Hepatic steatosis and hepatosplenomegaly.
4.  Possible constipation.

## 2015-02-11 ENCOUNTER — Other Ambulatory Visit: Payer: Self-pay | Admitting: Family Medicine

## 2015-03-29 ENCOUNTER — Ambulatory Visit: Payer: Self-pay | Admitting: Family Medicine

## 2015-04-02 ENCOUNTER — Encounter: Payer: Self-pay | Admitting: Family Medicine

## 2015-04-14 ENCOUNTER — Other Ambulatory Visit: Payer: Self-pay | Admitting: Family Medicine

## 2015-06-16 ENCOUNTER — Ambulatory Visit (INDEPENDENT_AMBULATORY_CARE_PROVIDER_SITE_OTHER): Payer: BLUE CROSS/BLUE SHIELD | Admitting: Family Medicine

## 2015-06-16 ENCOUNTER — Encounter: Payer: Self-pay | Admitting: Family Medicine

## 2015-06-16 VITALS — BP 126/90 | HR 76 | Ht 68.0 in | Wt 246.0 lb

## 2015-06-16 DIAGNOSIS — E1169 Type 2 diabetes mellitus with other specified complication: Secondary | ICD-10-CM

## 2015-06-16 DIAGNOSIS — Z9119 Patient's noncompliance with other medical treatment and regimen: Secondary | ICD-10-CM

## 2015-06-16 DIAGNOSIS — E119 Type 2 diabetes mellitus without complications: Secondary | ICD-10-CM

## 2015-06-16 DIAGNOSIS — E669 Obesity, unspecified: Secondary | ICD-10-CM | POA: Diagnosis not present

## 2015-06-16 DIAGNOSIS — Z91199 Patient's noncompliance with other medical treatment and regimen due to unspecified reason: Secondary | ICD-10-CM

## 2015-06-16 DIAGNOSIS — E785 Hyperlipidemia, unspecified: Secondary | ICD-10-CM

## 2015-06-16 LAB — COMPREHENSIVE METABOLIC PANEL
ALK PHOS: 45 U/L (ref 40–115)
ALT: 40 U/L (ref 9–46)
AST: 29 U/L (ref 10–40)
Albumin: 4.8 g/dL (ref 3.6–5.1)
BILIRUBIN TOTAL: 0.8 mg/dL (ref 0.2–1.2)
BUN: 5 mg/dL — ABNORMAL LOW (ref 7–25)
CO2: 27 mmol/L (ref 20–31)
Calcium: 9.6 mg/dL (ref 8.6–10.3)
Chloride: 100 mmol/L (ref 98–110)
Creat: 0.63 mg/dL (ref 0.60–1.35)
GLUCOSE: 164 mg/dL — AB (ref 65–99)
Potassium: 3.8 mmol/L (ref 3.5–5.3)
SODIUM: 141 mmol/L (ref 135–146)
TOTAL PROTEIN: 7.2 g/dL (ref 6.1–8.1)

## 2015-06-16 LAB — CBC WITH DIFFERENTIAL/PLATELET
BASOS PCT: 0 % (ref 0–1)
Basophils Absolute: 0 10*3/uL (ref 0.0–0.1)
EOS ABS: 0.2 10*3/uL (ref 0.0–0.7)
Eosinophils Relative: 2 % (ref 0–5)
HCT: 43.3 % (ref 39.0–52.0)
Hemoglobin: 15.2 g/dL (ref 13.0–17.0)
Lymphocytes Relative: 36 % (ref 12–46)
Lymphs Abs: 2.8 10*3/uL (ref 0.7–4.0)
MCH: 31.3 pg (ref 26.0–34.0)
MCHC: 35.1 g/dL (ref 30.0–36.0)
MCV: 89.1 fL (ref 78.0–100.0)
MONOS PCT: 5 % (ref 3–12)
MPV: 9.4 fL (ref 8.6–12.4)
Monocytes Absolute: 0.4 10*3/uL (ref 0.1–1.0)
NEUTROS PCT: 57 % (ref 43–77)
Neutro Abs: 4.4 10*3/uL (ref 1.7–7.7)
PLATELETS: 173 10*3/uL (ref 150–400)
RBC: 4.86 MIL/uL (ref 4.22–5.81)
RDW: 13.4 % (ref 11.5–15.5)
WBC: 7.8 10*3/uL (ref 4.0–10.5)

## 2015-06-16 LAB — POCT GLYCOSYLATED HEMOGLOBIN (HGB A1C): HEMOGLOBIN A1C: 6.8

## 2015-06-16 LAB — LIPID PANEL
CHOL/HDL RATIO: 6.3 ratio — AB (ref <=5.0)
CHOLESTEROL: 190 mg/dL (ref 125–200)
HDL: 30 mg/dL — AB (ref >=40)
TRIGLYCERIDES: 423 mg/dL — AB (ref <150)

## 2015-06-16 MED ORDER — METFORMIN HCL ER 500 MG PO TB24: ORAL_TABLET | ORAL | Status: DC

## 2015-06-16 NOTE — Progress Notes (Signed)
  Subjective:    Patient ID: Ronald Wiggins, male    DOB: 1976/09/20, 39 y.o.   MRN: [id number]  Ronald Wiggins is a 39 y.o. male who presents for follow-up of Type 2 diabetes mellitus.He has been lost to follow-up due to mainly him being in denial and he admits this. He is not ready to take better care of himself. He now has a new job which she likes much more than his previous job.He did stop taking the glipizide stating he had unacceptable side effects from this.  Home blood sugar records: patient not checking. When he did he did have blood sugars in the low 100s. Current symptoms/problems  None vertigo 2 to 3 times Daily foot checks:yes   Any foot concerns: He has not been checking his feet regularly. Exercise: more active now Eyes: no has not had one The following portions of the patient's history were reviewed and updated as appropriate: allergies, current medications, past medical history, past social history and problem list.  ROS as in subjective above.     Objective:    Physical Exam Alert and in no distress otherwise not examined.   Lab Review Diabetic Labs Latest Ref Rng 07/16/2014 07/14/2014 07/19/2008  Chol 0 - 200 mg/dL 409 - -  HDL >81 mg/dL 19(J) - -  Calc LDL 0 - 99 mg/dL NOT CALC - -  Triglycerides <150 mg/dL 478(G) - -  Creatinine 0.50 - 1.35 mg/dL - 9.56(O) 0.9   BP/Weight 07/16/2014 07/14/2014 12/25/2013 06/20/2013 04/20/2011  Systolic BP 130 142 130 130 130  Diastolic BP 90 81 94 80 90  Wt. (Lbs) 236 - 254 250 209  BMI 35.89 - - - -   Hb A1C 6.8 Ronald Wiggins  reports that he has quit smoking. His smoking use included Cigarettes. He smoked 0.10 packs per day. He has never used smokeless tobacco. He reports that he drinks alcohol. He reports that he does not use illicit drugs.     Assessment & Plan:    Type 2 diabetes mellitus without complication - Plan: POCT glycosylated hemoglobin (Hb A1C), Amb Referral to Nutrition and Diabetic E, CBC with Differential/Platelet,  Comprehensive metabolic panel  Obesity (BMI 30-39.9) - Plan: CBC with Differential/Platelet, Comprehensive metabolic panel  Hyperlipidemia associated with type 2 diabetes mellitus - Plan: Lipid panel  Personal history of noncompliance with medical treatment, presenting hazards to health   1. Rx changes: none 2. Education: Reviewed 'ABCs' of diabetes management (respective goals in parentheses):  A1C (<7), blood pressure (<130/80), and cholesterol (LDL <100). 3. Compliance at present is estimated to be poor. Efforts to improve compliance (if necessary) will be directed at increased exercise. 4. Follow up: 4 months Low he has not followed up properly, his diabetes seems to be fairly decent. I again went over the need for diet, exercise, foot care, eye care. He will be referred to diabetes education. Continue on his present metformin dosing. Also discussed the need for him to come in periodically.

## 2015-06-16 NOTE — Patient Instructions (Signed)
150 minutes a week of something physical which translates into roughly 20 minutes a day

## 2015-06-16 NOTE — Progress Notes (Deleted)
   Subjective:    Patient ID: Ronald Wiggins, male    DOB: [birth year], 39 y.o.   MRN: [id number]  HPI    Review of Systems     Objective:   Physical Exam        Assessment & Plan:

## 2015-07-09 ENCOUNTER — Encounter: Payer: Self-pay | Admitting: Family Medicine

## 2015-07-09 ENCOUNTER — Ambulatory Visit (INDEPENDENT_AMBULATORY_CARE_PROVIDER_SITE_OTHER): Payer: BLUE CROSS/BLUE SHIELD | Admitting: Family Medicine

## 2015-07-09 VITALS — BP 140/92 | HR 64 | Temp 98.0°F | Wt 241.6 lb

## 2015-07-09 DIAGNOSIS — R030 Elevated blood-pressure reading, without diagnosis of hypertension: Secondary | ICD-10-CM

## 2015-07-09 DIAGNOSIS — M5441 Lumbago with sciatica, right side: Secondary | ICD-10-CM | POA: Diagnosis not present

## 2015-07-09 MED ORDER — ATORVASTATIN CALCIUM 10 MG PO TABS: 10.0000 mg | ORAL_TABLET | Freq: Every day | ORAL | Status: DC

## 2015-07-09 MED ORDER — NAPROXEN 500 MG PO TABS: 500.0000 mg | ORAL_TABLET | Freq: Two times a day (BID) | ORAL | Status: DC

## 2015-07-09 MED ORDER — HYDROCODONE-ACETAMINOPHEN 5-325 MG PO TABS: 1.0000 | ORAL_TABLET | Freq: Four times a day (QID) | ORAL | Status: DC | PRN

## 2015-07-09 NOTE — Patient Instructions (Signed)
Take the antiinflammatory with food and use stronger pain medication if needed. Heat for 20 minutes and stretch. If you develop sudden numbness to your genital/rectal area or cannot urinate these are signs you need immediate medical care. Also if you were to lose control of your bowels or bladder this is a medical emergency.    Sciatica with Rehab The sciatic nerve runs from the back down the leg and is responsible for sensation and control of the muscles in the back (posterior) side of the thigh, lower leg, and foot. Sciatica is a condition that is characterized by inflammation of this nerve.  SYMPTOMS   Signs of nerve damage, including numbness and/or weakness along the posterior side of the lower extremity.  Pain in the back of the thigh that may also travel down the leg.  Pain that worsens when sitting for long periods of time.  Occasionally, pain in the back or buttock. CAUSES  Inflammation of the sciatic nerve is the cause of sciatica. The inflammation is due to something irritating the nerve. Common sources of irritation include:  Sitting for long periods of time.  Direct trauma to the nerve.  Arthritis of the spine.  Herniated or ruptured disk.  Slipping of the vertebrae (spondylolisthesis).  Pressure from soft tissues, such as muscles or ligament-like tissue (fascia). RISK INCREASES WITH:  Sports that place pressure or stress on the spine (football or weightlifting).  Poor strength and flexibility.  Failure to warm up properly before activity.  Family history of low back pain or disk disorders.  Previous back injury or surgery.  Poor body mechanics, especially when lifting, or poor posture. PREVENTION   Warm up and stretch properly before activity.  Maintain physical fitness:  Strength, flexibility, and endurance.  Cardiovascular fitness.  Learn and use proper technique, especially with posture and lifting. When possible, have coach correct improper  technique.  Avoid activities that place stress on the spine. PROGNOSIS If treated properly, then sciatica usually resolves within 6 weeks. However, occasionally surgery is necessary.  RELATED COMPLICATIONS   Permanent nerve damage, including pain, numbness, tingle, or weakness.  Chronic back pain.  Risks of surgery: infection, bleeding, nerve damage, or damage to surrounding tissues. TREATMENT Treatment initially involves resting from any activities that aggravate your symptoms. The use of ice and medication may help reduce pain and inflammation. The use of strengthening and stretching exercises may help reduce pain with activity. These exercises may be performed at home or with referral to a therapist. A therapist may recommend further treatments, such as transcutaneous electronic nerve stimulation (TENS) or ultrasound. Your caregiver may recommend corticosteroid injections to help reduce inflammation of the sciatic nerve. If symptoms persist despite non-surgical (conservative) treatment, then surgery may be recommended. MEDICATION  If pain medication is necessary, then nonsteroidal anti-inflammatory medications, such as aspirin and ibuprofen, or other minor pain relievers, such as acetaminophen, are often recommended.  Do not take pain medication for 7 days before surgery.  Prescription pain relievers may be given if deemed necessary by your caregiver. Use only as directed and only as much as you need.  Ointments applied to the skin may be helpful.  Corticosteroid injections may be given by your caregiver. These injections should be reserved for the most serious cases, because they may only be given a certain number of times. HEAT AND COLD  Cold treatment (icing) relieves pain and reduces inflammation. Cold treatment should be applied for 10 to 15 minutes every 2 to 3 hours for inflammation and  pain and immediately after any activity that aggravates your symptoms. Use ice packs or  massage the area with a piece of ice (ice massage).  Heat treatment may be used prior to performing the stretching and strengthening activities prescribed by your caregiver, physical therapist, or athletic trainer. Use a heat pack or soak the injury in warm water. SEEK MEDICAL CARE IF:  Treatment seems to offer no benefit, or the condition worsens.  Any medications produce adverse side effects. EXERCISES  RANGE OF MOTION (ROM) AND STRETCHING EXERCISES - Sciatica Most people with sciatic will find that their symptoms worsen with either excessive bending forward (flexion) or arching at the low back (extension). The exercises which will help resolve your symptoms will focus on the opposite motion. Your physician, physical therapist or athletic trainer will help you determine which exercises will be most helpful to resolve your low back pain. Do not complete any exercises without first consulting with your clinician. Discontinue any exercises which worsen your symptoms until you speak to your clinician. If you have pain, numbness or tingling which travels down into your buttocks, leg or foot, the goal of the therapy is for these symptoms to move closer to your back and eventually resolve. Occasionally, these leg symptoms will get better, but your low back pain may worsen; this is typically an indication of progress in your rehabilitation. Be certain to be very alert to any changes in your symptoms and the activities in which you participated in the 24 hours prior to the change. Sharing this information with your clinician will allow him/her to most efficiently treat your condition. These exercises may help you when beginning to rehabilitate your injury. Your symptoms may resolve with or without further involvement from your physician, physical therapist or athletic trainer. While completing these exercises, remember:   Restoring tissue flexibility helps normal motion to return to the joints. This allows  healthier, less painful movement and activity.  An effective stretch should be held for at least 30 seconds.  A stretch should never be painful. You should only feel a gentle lengthening or release in the stretched tissue. FLEXION RANGE OF MOTION AND STRETCHING EXERCISES: STRETCH - Flexion, Single Knee to Chest   Lie on a firm bed or floor with both legs extended in front of you.  Keeping one leg in contact with the floor, bring your opposite knee to your chest. Hold your leg in place by either grabbing behind your thigh or at your knee.  Pull until you feel a gentle stretch in your low back. Hold __________ seconds.  Slowly release your grasp and repeat the exercise with the opposite side. Repeat __________ times. Complete this exercise __________ times per day.  STRETCH - Flexion, Double Knee to Chest  Lie on a firm bed or floor with both legs extended in front of you.  Keeping one leg in contact with the floor, bring your opposite knee to your chest.  Tense your stomach muscles to support your back and then lift your other knee to your chest. Hold your legs in place by either grabbing behind your thighs or at your knees.  Pull both knees toward your chest until you feel a gentle stretch in your low back. Hold __________ seconds.  Tense your stomach muscles and slowly return one leg at a time to the floor. Repeat __________ times. Complete this exercise __________ times per day.  STRETCH - Low Trunk Rotation   Lie on a firm bed or floor. Keeping your legs  in front of you, bend your knees so they are both pointed toward the ceiling and your feet are flat on the floor.  Extend your arms out to the side. This will stabilize your upper body by keeping your shoulders in contact with the floor.  Gently and slowly drop both knees together to one side until you feel a gentle stretch in your low back. Hold for __________ seconds.  Tense your stomach muscles to support your low back as  you bring your knees back to the starting position. Repeat the exercise to the other side. Repeat __________ times. Complete this exercise __________ times per day  EXTENSION RANGE OF MOTION AND FLEXIBILITY EXERCISES: STRETCH - Extension, Prone on Elbows  Lie on your stomach on the floor, a bed will be too soft. Place your palms about shoulder width apart and at the height of your head.  Place your elbows under your shoulders. If this is too painful, stack pillows under your chest.  Allow your body to relax so that your hips drop lower and make contact more completely with the floor.  Hold this position for __________ seconds.  Slowly return to lying flat on the floor. Repeat __________ times. Complete this exercise __________ times per day.  RANGE OF MOTION - Extension, Prone Press Ups  Lie on your stomach on the floor, a bed will be too soft. Place your palms about shoulder width apart and at the height of your head.  Keeping your back as relaxed as possible, slowly straighten your elbows while keeping your hips on the floor. You may adjust the placement of your hands to maximize your comfort. As you gain motion, your hands will come more underneath your shoulders.  Hold this position __________ seconds.  Slowly return to lying flat on the floor. Repeat __________ times. Complete this exercise __________ times per day.  STRENGTHENING EXERCISES - Sciatica  These exercises may help you when beginning to rehabilitate your injury. These exercises should be done near your "sweet spot." This is the neutral, low-back arch, somewhere between fully rounded and fully arched, that is your least painful position. When performed in this safe range of motion, these exercises can be used for people who have either a flexion or extension based injury. These exercises may resolve your symptoms with or without further involvement from your physician, physical therapist or athletic trainer. While completing  these exercises, remember:   Muscles can gain both the endurance and the strength needed for everyday activities through controlled exercises.  Complete these exercises as instructed by your physician, physical therapist or athletic trainer. Progress with the resistance and repetition exercises only as your caregiver advises.  You may experience muscle soreness or fatigue, but the pain or discomfort you are trying to eliminate should never worsen during these exercises. If this pain does worsen, stop and make certain you are following the directions exactly. If the pain is still present after adjustments, discontinue the exercise until you can discuss the trouble with your clinician. STRENGTHENING - Deep Abdominals, Pelvic Tilt   Lie on a firm bed or floor. Keeping your legs in front of you, bend your knees so they are both pointed toward the ceiling and your feet are flat on the floor.  Tense your lower abdominal muscles to press your low back into the floor. This motion will rotate your pelvis so that your tail bone is scooping upwards rather than pointing at your feet or into the floor.  With a gentle tension  and even breathing, hold this position for __________ seconds. Repeat __________ times. Complete this exercise __________ times per day.  STRENGTHENING - Abdominals, Crunches   Lie on a firm bed or floor. Keeping your legs in front of you, bend your knees so they are both pointed toward the ceiling and your feet are flat on the floor. Cross your arms over your chest.  Slightly tip your chin down without bending your neck.  Tense your abdominals and slowly lift your trunk high enough to just clear your shoulder blades. Lifting higher can put excessive stress on the low back and does not further strengthen your abdominal muscles.  Control your return to the starting position. Repeat __________ times. Complete this exercise __________ times per day.  STRENGTHENING - Quadruped, Opposite  UE/LE Lift  Assume a hands and knees position on a firm surface. Keep your hands under your shoulders and your knees under your hips. You may place padding under your knees for comfort.  Find your neutral spine and gently tense your abdominal muscles so that you can maintain this position. Your shoulders and hips should form a rectangle that is parallel with the floor and is not twisted.  Keeping your trunk steady, lift your right hand no higher than your shoulder and then your left leg no higher than your hip. Make sure you are not holding your breath. Hold this position __________ seconds.  Continuing to keep your abdominal muscles tense and your back steady, slowly return to your starting position. Repeat with the opposite arm and leg. Repeat __________ times. Complete this exercise __________ times per day.  STRENGTHENING - Abdominals and Quadriceps, Straight Leg Raise   Lie on a firm bed or floor with both legs extended in front of you.  Keeping one leg in contact with the floor, bend the other knee so that your foot can rest flat on the floor.  Find your neutral spine, and tense your abdominal muscles to maintain your spinal position throughout the exercise.  Slowly lift your straight leg off the floor about 6 inches for a count of 15, making sure to not hold your breath.  Still keeping your neutral spine, slowly lower your leg all the way to the floor. Repeat this exercise with each leg __________ times. Complete this exercise __________ times per day. POSTURE AND BODY MECHANICS CONSIDERATIONS - Sciatica Keeping correct posture when sitting, standing or completing your activities will reduce the stress put on different body tissues, allowing injured tissues a chance to heal and limiting painful experiences. The following are general guidelines for improved posture. Your physician or physical therapist will provide you with any instructions specific to your needs. While reading these  guidelines, remember:  The exercises prescribed by your provider will help you have the flexibility and strength to maintain correct postures.  The correct posture provides the optimal environment for your joints to work. All of your joints have less wear and tear when properly supported by a spine with good posture. This means you will experience a healthier, less painful body.  Correct posture must be practiced with all of your activities, especially prolonged sitting and standing. Correct posture is as important when doing repetitive low-stress activities (typing) as it is when doing a single heavy-load activity (lifting). RESTING POSITIONS Consider which positions are most painful for you when choosing a resting position. If you have pain with flexion-based activities (sitting, bending, stooping, squatting), choose a position that allows you to rest in a less flexed posture.  You would want to avoid curling into a fetal position on your side. If your pain worsens with extension-based activities (prolonged standing, working overhead), avoid resting in an extended position such as sleeping on your stomach. Most people will find more comfort when they rest with their spine in a more neutral position, neither too rounded nor too arched. Lying on a non-sagging bed on your side with a pillow between your knees, or on your back with a pillow under your knees will often provide some relief. Keep in mind, being in any one position for a prolonged period of time, no matter how correct your posture, can still lead to stiffness. PROPER SITTING POSTURE In order to minimize stress and discomfort on your spine, you must sit with correct posture Sitting with good posture should be effortless for a healthy body. Returning to good posture is a gradual process. Many people can work toward this most comfortably by using various supports until they have the flexibility and strength to maintain this posture on their  own. When sitting with proper posture, your ears will fall over your shoulders and your shoulders will fall over your hips. You should use the back of the chair to support your upper back. Your low back will be in a neutral position, just slightly arched. You may place a small pillow or folded towel at the base of your low back for support.  When working at a desk, create an environment that supports good, upright posture. Without extra support, muscles fatigue and lead to excessive strain on joints and other tissues. Keep these recommendations in mind: CHAIR:   A chair should be able to slide under your desk when your back makes contact with the back of the chair. This allows you to work closely.  The chair's height should allow your eyes to be level with the upper part of your monitor and your hands to be slightly lower than your elbows. BODY POSITION  Your feet should make contact with the floor. If this is not possible, use a foot rest.  Keep your ears over your shoulders. This will reduce stress on your neck and low back. INCORRECT SITTING POSTURES   If you are feeling tired and unable to assume a healthy sitting posture, do not slouch or slump. This puts excessive strain on your back tissues, causing more damage and pain. Healthier options include:  Using more support, like a lumbar pillow.  Switching tasks to something that requires you to be upright or walking.  Talking a brief walk.  Lying down to rest in a neutral-spine position. PROLONGED STANDING WHILE SLIGHTLY LEANING FORWARD  When completing a task that requires you to lean forward while standing in one place for a long time, place either foot up on a stationary 2-4 inch high object to help maintain the best posture. When both feet are on the ground, the low back tends to lose its slight inward curve. If this curve flattens (or becomes too large), then the back and your other joints will experience too much stress, fatigue more  quickly and can cause pain.  CORRECT STANDING POSTURES Proper standing posture should be assumed with all daily activities, even if they only take a few moments, like when brushing your teeth. As in sitting, your ears should fall over your shoulders and your shoulders should fall over your hips. You should keep a slight tension in your abdominal muscles to brace your spine. Your tailbone should point down to the ground, not  behind your body, resulting in an over-extended swayback posture.  INCORRECT STANDING POSTURES  Common incorrect standing postures include a forward head, locked knees and/or an excessive swayback. WALKING Walk with an upright posture. Your ears, shoulders and hips should all line-up. PROLONGED ACTIVITY IN A FLEXED POSITION When completing a task that requires you to bend forward at your waist or lean over a low surface, try to find a way to stabilize 3 of 4 of your limbs. You can place a hand or elbow on your thigh or rest a knee on the surface you are reaching across. This will provide you more stability so that your muscles do not fatigue as quickly. By keeping your knees relaxed, or slightly bent, you will also reduce stress across your low back. CORRECT LIFTING TECHNIQUES DO :   Assume a wide stance. This will provide you more stability and the opportunity to get as close as possible to the object which you are lifting.  Tense your abdominals to brace your spine; then bend at the knees and hips. Keeping your back locked in a neutral-spine position, lift using your leg muscles. Lift with your legs, keeping your back straight.  Test the weight of unknown objects before attempting to lift them.  Try to keep your elbows locked down at your sides in order get the best strength from your shoulders when carrying an object.  Always ask for help when lifting heavy or awkward objects. INCORRECT LIFTING TECHNIQUES DO NOT:   Lock your knees when lifting, even if it is a small  object.  Bend and twist. Pivot at your feet or move your feet when needing to change directions.  Assume that you cannot safely pick up a paperclip without proper posture. Document Released: 10/02/2005 Document Revised: 02/16/2014 Document Reviewed: 01/14/2009 [facility] Patient Information 2015 [place], [place]. This information is not intended to replace advice given to you by your health care provider. Make sure you discuss any questions you have with your health care provider.

## 2015-07-09 NOTE — Progress Notes (Signed)
   Subjective:    Patient ID: Ronald Wiggins, male    DOB: 21-Dec-1975, 39 y.o.   MRN: [id number]  HPI He is here for 2 days of low back pain that radiating down into his right buttock and into his testicles. He denies injury and states he felt like he got kicked and describes pain as a constant dull pain.Reports pain is worse sitting and laying and improves a little bit with standing and moving. Also reports nausea, abdominal cramping and loose stool. Denies fever, chills, numbness, tingling, weakness, urinary symptoms. Reports normal ability to have erection. He had to lift something heavy yesterday and thinks this flared it up even more.  His blood pressure is elevated today.    He reports that he has not yet started taking his Lipitor. But he does take metformin.   Review of Systems Pertinent positives and negatives in the history of present illness.    Objective:   Physical Exam  Constitutional: He is oriented to person, place, and time. He appears well-developed and well-nourished. He appears distressed.  Neck: Normal range of motion and full passive range of motion without pain. Neck supple.  Abdominal: Soft. Normal appearance and bowel sounds are normal. There is no hepatosplenomegaly. There is no tenderness. There is no rigidity, no rebound, no guarding, no CVA tenderness, no tenderness at McBurney's point and negative Murphy's sign.  Musculoskeletal:  Tenderness over the right sciatic notch without erythema or edema. Range of motion limited due to pain. No weakness, sensation and strength normal. Pain with straight leg test  Neurological: He is alert and oriented to person, place, and time. He has normal strength and normal reflexes. No cranial nerve deficit or sensory deficit.  Skin: Skin is warm and dry. No rash noted.      Urinalysis dipstick negative    Assessment & Plan:  Elevated blood pressure (not hypertension)  Right-sided low back pain with right-sided  sciatica  Discussed red flags for back pain and when he should seek immediate medical attention. Prescribed naproxen and a stronger pain medication for him to take for the next few days. Also discussed heat and stretches and I demonstrated those in the office. Suspect that he may also have a viral component causing his nausea and loose stool. Encouraged him to let me know how he is doing in a few days or sooner if he gets worse. Discussed that his blood pressure is elevated today and that he needs to check it when he is not in pain. He will let us know if it continues to be elevated

## 2015-07-16 ENCOUNTER — Ambulatory Visit (INDEPENDENT_AMBULATORY_CARE_PROVIDER_SITE_OTHER): Payer: BLUE CROSS/BLUE SHIELD | Admitting: Family Medicine

## 2015-07-16 ENCOUNTER — Encounter: Payer: Self-pay | Admitting: Family Medicine

## 2015-07-16 ENCOUNTER — Ambulatory Visit
Admission: RE | Admit: 2015-07-16 | Discharge: 2015-07-16 | Disposition: A | Discharge status: Home / Self Care | Payer: BLUE CROSS/BLUE SHIELD | Source: Ambulatory Visit | Attending: Family Medicine | Admitting: Family Medicine

## 2015-07-16 VITALS — BP 136/90 | HR 78 | Wt 241.0 lb

## 2015-07-16 DIAGNOSIS — M461 Sacroiliitis, not elsewhere classified: Secondary | ICD-10-CM

## 2015-07-16 MED ORDER — PREDNISONE 10 MG (48) PO TBPK: ORAL_TABLET | Freq: Every day | ORAL | Status: DC

## 2015-07-16 NOTE — Progress Notes (Signed)
   Subjective:    Patient ID: Ronald Wiggins, male    DOB: 1976/02/15, 39 y.o.   MRN: [id number]  HPI   He is here for recheck. He was seen several days ago and treated for low back pain with some sciatic symptoms. He was given Vicodin and Naprosyn. He did not get the Naprosyn filled. He now states that the pain is more on the left side with radiation into the left scrotum but no numbness, tingling or weakness in his leg. No bowel or bladder issues.   Review of Systems     Objective:   Physical Exam Alert and in distress and sweating. Poor motion of the lumbar spine. Quite tender to palpation over the left SI joint. Pearlean Brownie test was positive as was stork test. Negative straight leg raising on the left over straight leg raising on the right did cause left-sided pain. Scrotum does show some slight enlargement of the left testes which is normal for him. No tenderness to palpation. No skin lesions noted.       Assessment & Plan:  Sacroiliitis - Plan: DG Lumbar Spine 2-3 Views, predniSONE (STERAPRED UNI-PAK 48 TAB) 10 MG (48) TBPK tablet  his pain is out of proportion to the diagnosis which makes me wonder if there something also going on. I will get x-rays, place him on a steroid Dosepak. He is to use the Naprosyn and Vicodin and I will touch bases with him on Monday.

## 2015-07-19 ENCOUNTER — Telehealth: Payer: Self-pay | Admitting: Family Medicine

## 2015-07-19 NOTE — Telephone Encounter (Signed)
Pt states is doing a lot better, said able to stand up now, states still having some left back pain but definitely better.  States steroid and anti inflammatory are helping a lot.  Said thanks for checking on him

## 2015-11-21 ENCOUNTER — Other Ambulatory Visit: Payer: Self-pay | Admitting: Family Medicine

## 2015-12-21 IMAGING — CR DG LUMBAR SPINE 2-3V
3 series · 3 of 3 positions shown · non-contrast
Comparison: Abdominal and pelvic CT scan dated July 14, 2014

CLINICAL DATA: Low back pain and left SI joint region pain for the
past 2 weeks, no known injury.

EXAM:
LUMBAR SPINE - 2-3 VIEW

[t l-spine a.p.]
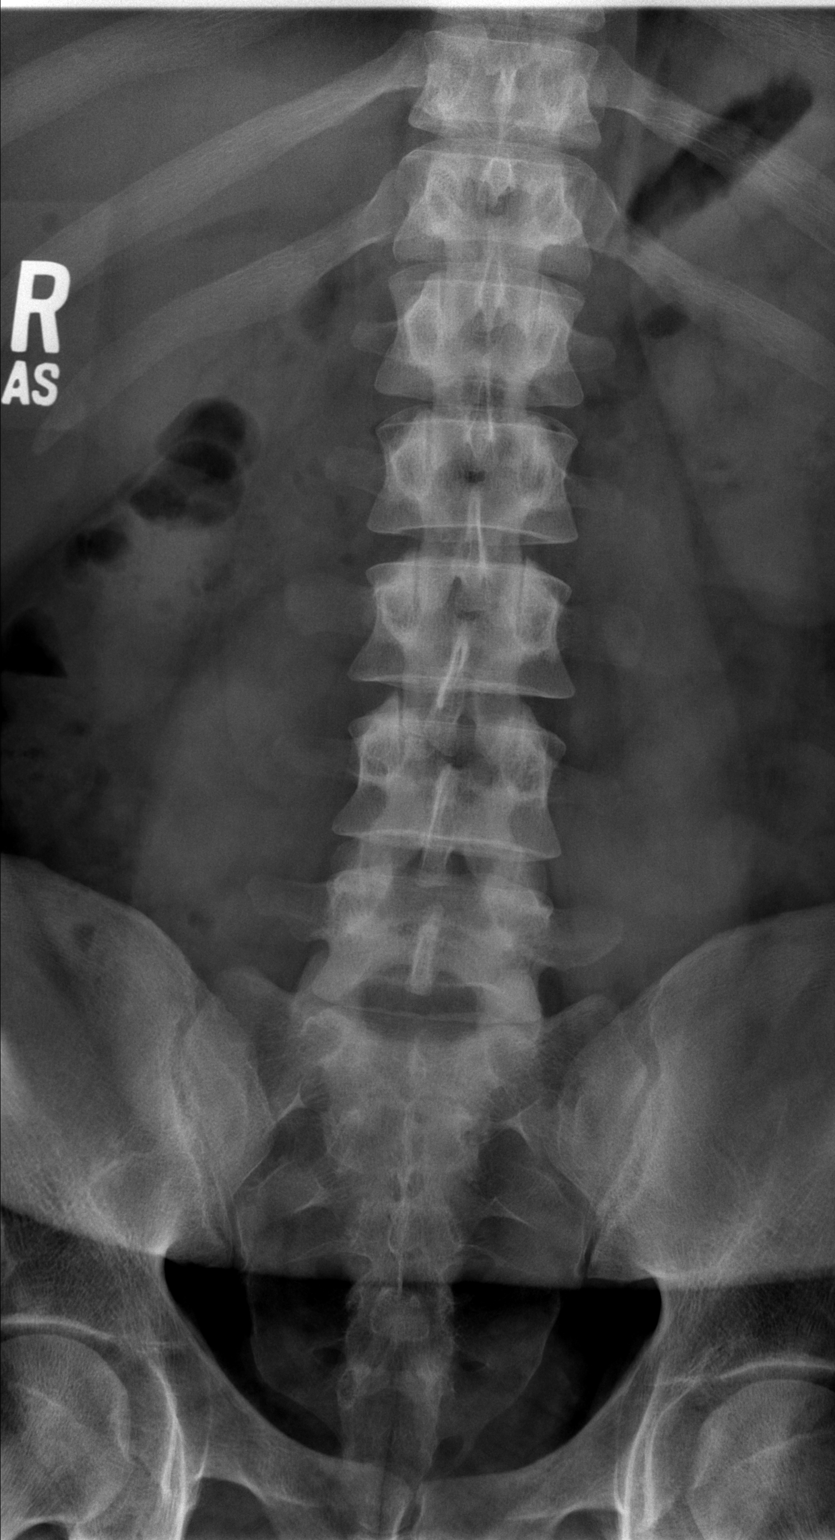

[t l-spine lat]
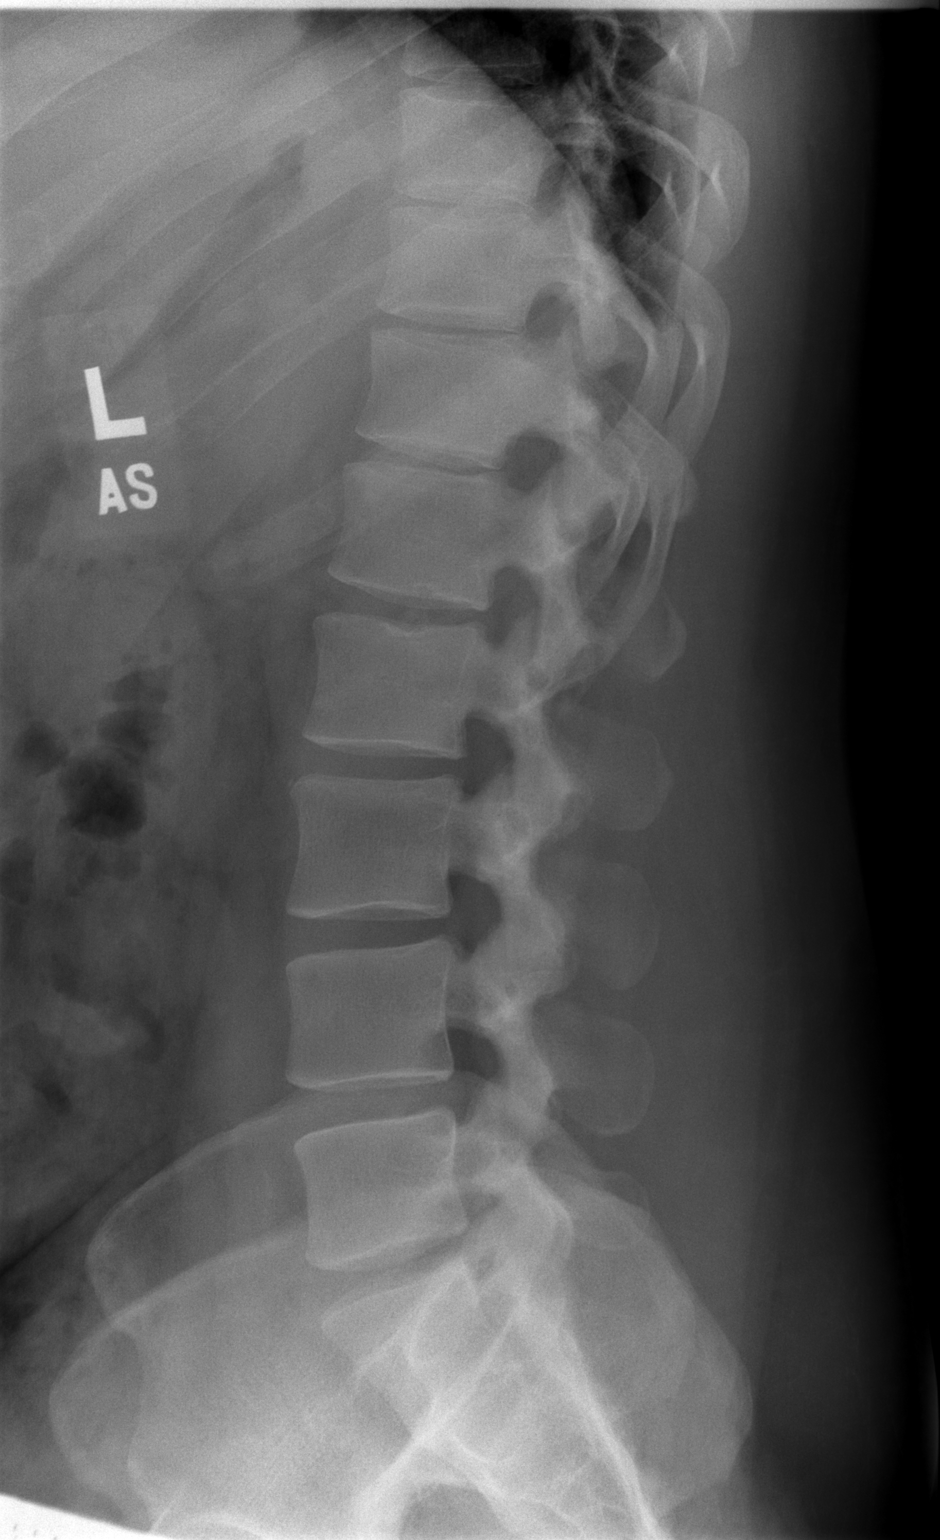

[t l-spine l5-s1 spot]
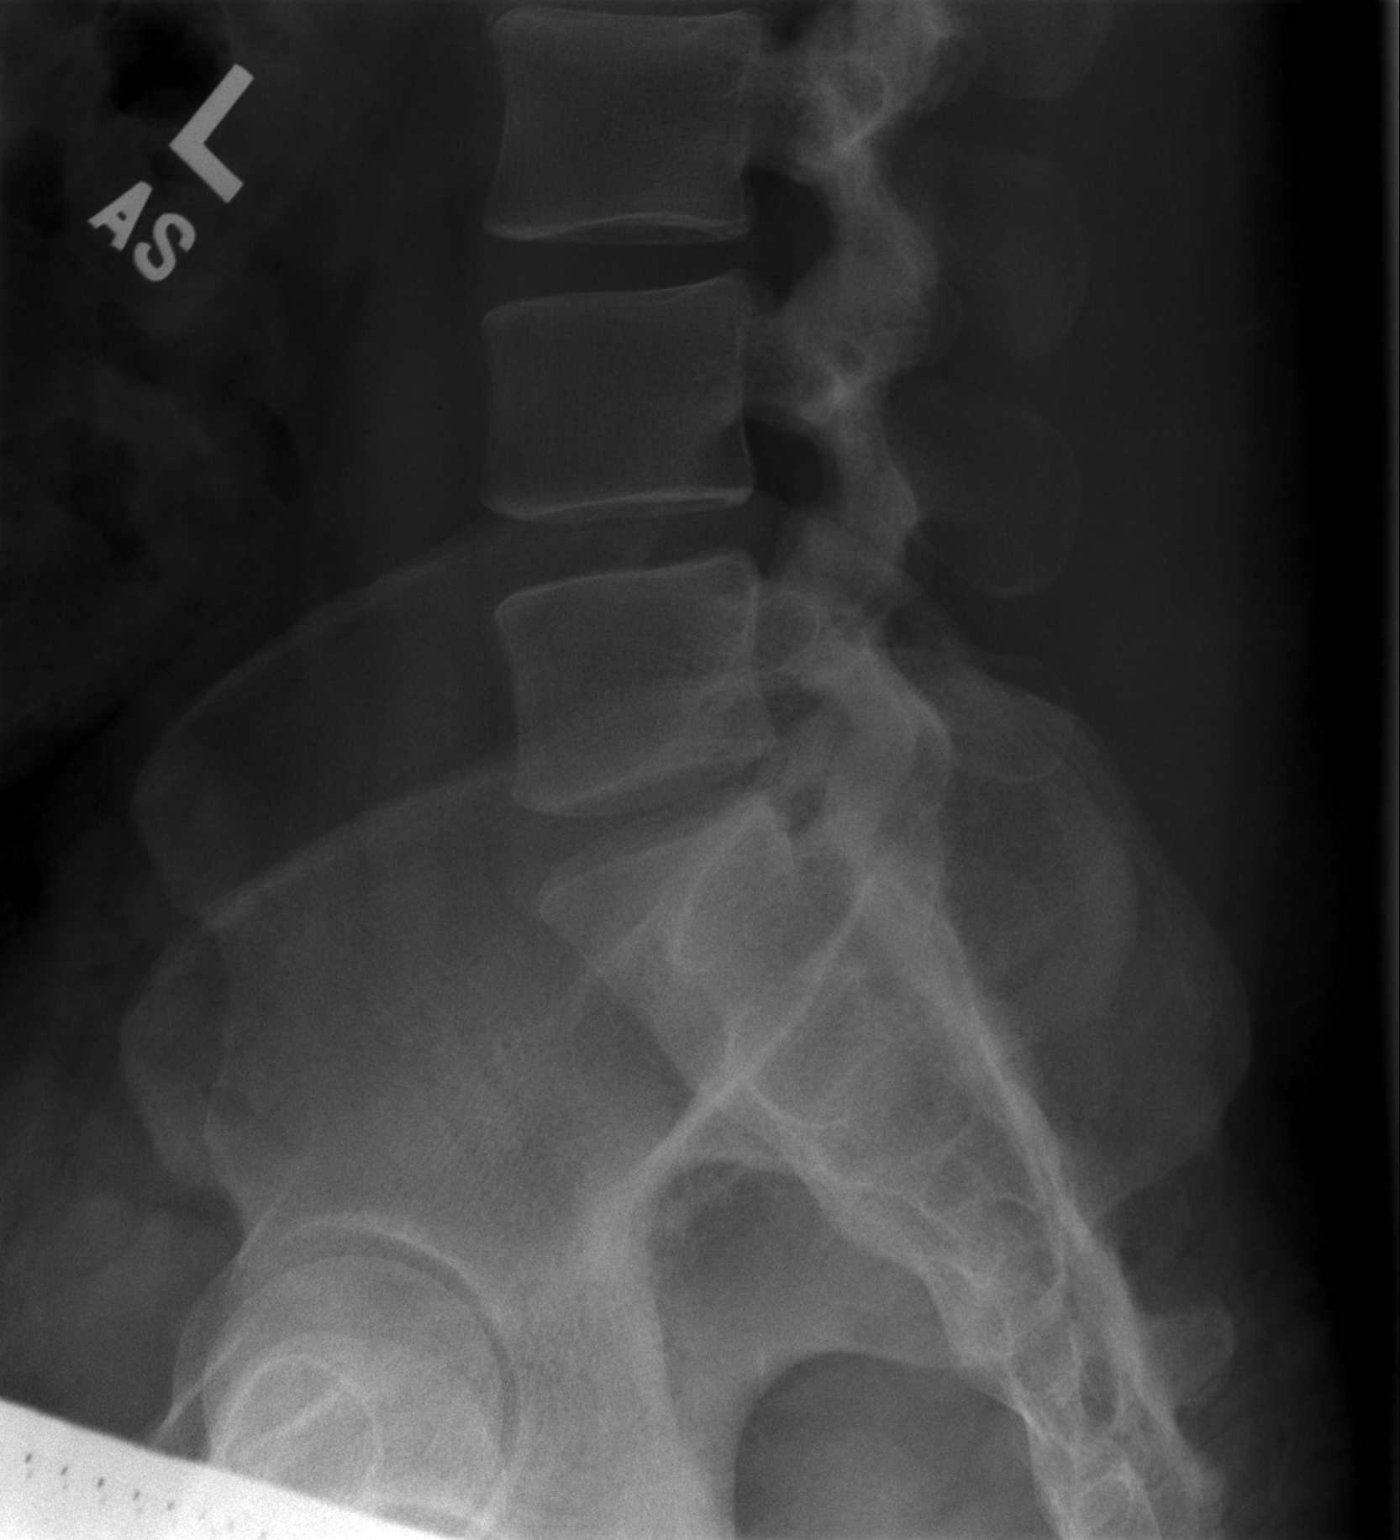

[3 of 3 positions shown; findings below may reference images not displayed]

FINDINGS: The lumbar vertebral bodies are preserved in height. There is mild
disc space narrowing at L4-5. There is no spondylolisthesis. There
no significant facet joint hypertrophy. Is the pedicles and
transverse processes are intact. The observed portions of the sacrum
and SI joints exhibit no acute abnormalities.
IMPRESSION: There is very mild loss of height of the L4-5 disc. Otherwise the
examination is within the limits of normal. No SI joint abnormality
is observed on the AP view submitted.

## 2016-01-11 ENCOUNTER — Other Ambulatory Visit: Payer: Self-pay | Admitting: Family Medicine

## 2016-02-17 ENCOUNTER — Other Ambulatory Visit: Payer: Self-pay | Admitting: Family Medicine

## 2016-02-17 NOTE — Telephone Encounter (Signed)
Left message for pt to call back and schedule an appointment for a DM check as he is overdue and i will only refill med for 30 days for him to get in.

## 2016-03-16 ENCOUNTER — Other Ambulatory Visit: Payer: Self-pay

## 2016-03-16 ENCOUNTER — Telehealth: Payer: Self-pay | Admitting: Family Medicine

## 2016-03-16 MED ORDER — ATORVASTATIN CALCIUM 10 MG PO TABS: 10.0000 mg | ORAL_TABLET | Freq: Every day | ORAL | Status: DC

## 2016-03-16 NOTE — Telephone Encounter (Signed)
Pt called and was requesting a refill on his Lipitor pt made a diabetes check for June 13th pt uses [facility] [address] - [place], [place] - [address]

## 2016-03-16 NOTE — Telephone Encounter (Signed)
Sent in med

## 2016-03-28 ENCOUNTER — Ambulatory Visit: Payer: BLUE CROSS/BLUE SHIELD | Admitting: Family Medicine

## 2016-04-04 ENCOUNTER — Ambulatory Visit: Payer: BLUE CROSS/BLUE SHIELD | Admitting: Family Medicine

## 2016-04-11 ENCOUNTER — Other Ambulatory Visit: Payer: Self-pay

## 2016-04-11 ENCOUNTER — Other Ambulatory Visit: Payer: Self-pay | Admitting: Family Medicine

## 2016-04-11 ENCOUNTER — Ambulatory Visit (INDEPENDENT_AMBULATORY_CARE_PROVIDER_SITE_OTHER): Payer: BLUE CROSS/BLUE SHIELD | Admitting: Family Medicine

## 2016-04-11 ENCOUNTER — Encounter: Payer: Self-pay | Admitting: Family Medicine

## 2016-04-11 VITALS — BP 122/82 | HR 84 | Ht 67.0 in | Wt 239.2 lb

## 2016-04-11 DIAGNOSIS — E785 Hyperlipidemia, unspecified: Secondary | ICD-10-CM | POA: Diagnosis not present

## 2016-04-11 DIAGNOSIS — E669 Obesity, unspecified: Secondary | ICD-10-CM

## 2016-04-11 DIAGNOSIS — E118 Type 2 diabetes mellitus with unspecified complications: Secondary | ICD-10-CM | POA: Diagnosis not present

## 2016-04-11 DIAGNOSIS — E1169 Type 2 diabetes mellitus with other specified complication: Secondary | ICD-10-CM | POA: Insufficient documentation

## 2016-04-11 LAB — CBC WITH DIFFERENTIAL/PLATELET
BASOS ABS: 0 {cells}/uL (ref 0–200)
Basophils Relative: 0 %
EOS ABS: 154 {cells}/uL (ref 15–500)
Eosinophils Relative: 2 %
HCT: 45.7 % (ref 38.5–50.0)
Hemoglobin: 16 g/dL (ref 13.2–17.1)
LYMPHS PCT: 33 %
Lymphs Abs: 2541 cells/uL (ref 850–3900)
MCH: 31.2 pg (ref 27.0–33.0)
MCHC: 35 g/dL (ref 32.0–36.0)
MCV: 89.1 fL (ref 80.0–100.0)
MONOS PCT: 7 %
MPV: 9.7 fL (ref 7.5–12.5)
Monocytes Absolute: 539 cells/uL (ref 200–950)
NEUTROS ABS: 4466 {cells}/uL (ref 1500–7800)
Neutrophils Relative %: 58 %
PLATELETS: 162 10*3/uL (ref 140–400)
RBC: 5.13 MIL/uL (ref 4.20–5.80)
RDW: 13.3 % (ref 11.0–15.0)
WBC: 7.7 10*3/uL (ref 4.0–10.5)

## 2016-04-11 LAB — POCT GLYCOSYLATED HEMOGLOBIN (HGB A1C): Hemoglobin A1C: 6.5

## 2016-04-11 MED ORDER — GLUCOSE BLOOD VI STRP
ORAL_STRIP | Status: DC
Start: 2016-04-11 — End: 2020-01-02

## 2016-04-11 MED ORDER — BAYER CONTOUR NEXT MONITOR W/DEVICE KIT: PACK | Status: DC

## 2016-04-11 MED ORDER — LANCETS MISC. MISC: Status: DC

## 2016-04-11 NOTE — Progress Notes (Signed)
  Subjective:    Patient ID: Ronald Wiggins, male    DOB: Aug 25, 1976, 40 y.o.   MRN: [id number]  Ronald Wiggins is a 40 y.o. male who presents for follow-up of Type 2 diabetes mellitus.  Patient is not checking home blood sugars.  He states that he needs a new machine. He did not bother calling us to tell at his machine wasn't working. Home blood sugar records: none How often is blood sugars being checked: ? Current symptoms/problems none Daily foot checks: yes   Any foot concerns: none Last eye exam: ? When he was a kid Exercise: Mainly through work. He continues on his Lipitor and has had no muscle aches or pains. He does have a previous history of difficulty with kidney stones but none in the last several years. Presently he is not on blood pressure medication.  The following portions of the patient's history were reviewed and updated as appropriate: allergies, current medications, past medical history, past social history and problem list.  ROS as in subjective above.     Objective:    Physical Exam Alert and in no distress otherwise not examined.  Lab Review Diabetic Labs Latest Ref Rng 06/16/2015 07/16/2014 07/14/2014 07/19/2008  HbA1c - 6.8 - - -  Chol 125 - 200 mg/dL 956190 213187 - -  HDL >=08>=40 mg/dL 65(H30(L) 84(O29(L) - -  Calc LDL <130 mg/dL NOT CALC NOT CALC - -  Triglycerides <150 mg/dL 962(X423(H) 528(U631(H) - -  Creatinine 0.60 - 1.35 mg/dL 1.320.63 - 4.40(N0.49(L) 0.9   BP/Weight 07/16/2015 07/09/2015 06/16/2015 07/16/2014 07/14/2014  Systolic BP 136 140 126 130 142  Diastolic BP 90 92 90 90 81  Wt. (Lbs) 241 241.6 246 236 -  BMI 36.65 36.74 37.41 35.89 -   A1c is 6.5 Ronald Wiggins  reports that he has quit smoking. His smoking use included Cigarettes. He smoked 0.10 packs per day. He has never used smokeless tobacco. He reports that he drinks alcohol. He reports that he does not use illicit drugs.     Assessment & Plan:    Type 2 diabetes mellitus with complication, without long-term current use of  insulin (HCC) - Plan: POCT glycosylated hemoglobin (Hb A1C), CBC with Differential/Platelet, Comprehensive metabolic panel, Lipid panel, POCT UA - Microalbumin  Obesity (BMI 30-39.9) - Plan: CBC with Differential/Platelet, Comprehensive metabolic panel  Hyperlipidemia associated with type 2 diabetes mellitus (HCC) - Plan: Lipid panel   1. Rx changes: none 2. Education: Reviewed 'ABCs' of diabetes management (respective goals in parentheses):  A1C (<7), blood pressure (<130/80), and cholesterol (LDL <100). 3. Compliance at present is estimated to be fair. Efforts to improve compliance (if necessary) will be directed at increased exercise. 4. Follow up: 4 months  5. Encouraged him to call if he runs out of medications or has any difficulties with any of his medications. Discussed the fact that he will eventually placed on a blood pressure medication to help with kidney preservation. Encouraged him to check his blood sugars 2 hours after meals as well as before. Kirsten to become more physically active especially when he is at work. He has noted that his alcohol consumption has diminished.

## 2016-04-12 LAB — LIPID PANEL
CHOLESTEROL: 186 mg/dL (ref 125–200)
HDL: 36 mg/dL — AB (ref >=40)
TRIGLYCERIDES: 407 mg/dL — AB (ref <150)
Total CHOL/HDL Ratio: 5.2 Ratio — ABNORMAL HIGH (ref <=5.0)

## 2016-04-12 LAB — COMPREHENSIVE METABOLIC PANEL
ALT: 49 U/L — ABNORMAL HIGH (ref 9–46)
AST: 34 U/L (ref 10–40)
Albumin: 4.8 g/dL (ref 3.6–5.1)
Alkaline Phosphatase: 45 U/L (ref 40–115)
BUN: 8 mg/dL (ref 7–25)
CALCIUM: 9.4 mg/dL (ref 8.6–10.3)
CHLORIDE: 99 mmol/L (ref 98–110)
CO2: 25 mmol/L (ref 20–31)
Creat: 0.67 mg/dL (ref 0.60–1.35)
Glucose, Bld: 125 mg/dL — ABNORMAL HIGH (ref 65–99)
POTASSIUM: 4.1 mmol/L (ref 3.5–5.3)
Sodium: 137 mmol/L (ref 135–146)
TOTAL PROTEIN: 7.5 g/dL (ref 6.1–8.1)
Total Bilirubin: 0.9 mg/dL (ref 0.2–1.2)

## 2016-04-17 ENCOUNTER — Other Ambulatory Visit: Payer: Self-pay | Admitting: Family Medicine

## 2016-08-15 ENCOUNTER — Ambulatory Visit (INDEPENDENT_AMBULATORY_CARE_PROVIDER_SITE_OTHER): Payer: BLUE CROSS/BLUE SHIELD | Admitting: Family Medicine

## 2016-08-15 ENCOUNTER — Encounter: Payer: Self-pay | Admitting: Family Medicine

## 2016-08-15 VITALS — BP 124/82 | HR 72 | Ht 67.0 in | Wt 240.0 lb

## 2016-08-15 DIAGNOSIS — E1169 Type 2 diabetes mellitus with other specified complication: Secondary | ICD-10-CM | POA: Diagnosis not present

## 2016-08-15 DIAGNOSIS — E118 Type 2 diabetes mellitus with unspecified complications: Secondary | ICD-10-CM | POA: Diagnosis not present

## 2016-08-15 DIAGNOSIS — E785 Hyperlipidemia, unspecified: Secondary | ICD-10-CM

## 2016-08-15 DIAGNOSIS — E669 Obesity, unspecified: Secondary | ICD-10-CM

## 2016-08-15 LAB — POCT GLYCOSYLATED HEMOGLOBIN (HGB A1C): HEMOGLOBIN A1C: 7.4

## 2016-08-15 NOTE — Progress Notes (Signed)
  Subjective:    Patient ID: Ronald Wiggins, male    DOB: [birth year], 40 y.o.   MRN: [id number]  Ronald Wiggins is a 40 y.o. male who presents for follow-up of Type 2 diabetes mellitus.  Patient is checking home blood sugars.   Home blood sugar records: 108 to 243. He has been checking his blood sugars at random times. How often is blood sugars being checked: once a day Current symptoms/problem none Daily foot checks: yes  Any foot concerns: none Last eye exam: Patient states that he will set up an appointment based on his insurance. Exercise: none. His work keeps him active but not overly physical. He continues on metformin and is having no difficulty with this. He also takes atorvastatin has had no muscle aches or pains. Presently he is not on a blood pressure medication. He notes difficulty with sleep and this is been going on for quite some time. He lays in bed and thinks about various stresses that are going on in his life and has been unable to shut down. The following portions of the patient's history were reviewed and updated as appropriate: allergies, current medications, past medical history, past social history and problem list.  ROS as in subjective above.     Objective:    Physical Exam Alert and in no distress otherwise not examined.   Lab Review Diabetic Labs Latest Ref Rng & Units 08/15/2016 04/11/2016 06/16/2015 07/16/2014 07/14/2014  HbA1c - 7.4 6.5 6.8 - -  Chol 125 - 200 mg/dL - 409186 811190 914187 -  HDL >=78>=40 mg/dL - 29(F36(L) 62(Z30(L) 30(Q29(L) -  Calc LDL <130 mg/dL - NOT CALC NOT CALC NOT CALC -  Triglycerides <150 mg/dL - 657(Q407(H) 469(G423(H) 295(M631(H) -  Creatinine 0.60 - 1.35 mg/dL - 8.410.67 3.240.63 - 4.01(U0.49(L)   BP/Weight 08/15/2016 04/11/2016 07/16/2015 07/09/2015 06/16/2015  Systolic BP 124 122 136 140 126  Diastolic BP 82 82 90 92 90  Wt. (Lbs) 240 239.2 241 241.6 246  BMI 37.59 37.46 36.65 36.74 37.41    A1c is 7.4 Ronald Wiggins  reports that he has quit smoking. His smoking use included  Cigarettes. He smoked 0.10 packs per day. He has never used smokeless tobacco. He reports that he drinks alcohol. He reports that he does not use drugs.     Assessment & Plan:    Hyperlipidemia associated with type 2 diabetes mellitus (HCC) - Plan: HgB A1c  Type 2 diabetes mellitus with complication, without long-term current use of insulin (HCC)  Obesity (BMI 30-39.9)   1. Rx changes: none 2. Education: Reviewed 'ABCs' of diabetes management (respective goals in parentheses):  A1C (<7), blood pressure (<130/80), and cholesterol (LDL <100). 3. Compliance at present is estimated to be fair. Efforts to improve compliancewill be directed at diet and exercise. Information given on websites to go to. He has not been to diabetes education and is not interested due to time constraints. Discussed the need for him to make further changes or we will need to add medication. He would like to hold off for several months and if no improvement, I said I would need make some medication changes. 4. Follow up: 4 months  5. He was also given information on sleep and sleep hygiene. I informed him that sleeping is a learned behavior and he is large not to sleep. He will keep me informed as to his progress.

## 2016-08-15 NOTE — Patient Instructions (Addendum)
Check your blood sugars either before a meal or 2 hours after a meal.Insomnia Insomnia is a sleep disorder that makes it difficult to fall asleep or to stay asleep. Insomnia can cause tiredness (fatigue), low energy, difficulty concentrating, mood swings, and poor performance at work or school.  There are three different ways to classify insomnia:  Difficulty falling asleep.  Difficulty staying asleep.  Waking up too early in the morning. Any type of insomnia can be long-term (chronic) or short-term (acute). Both are common. Short-term insomnia usually lasts for three months or less. Chronic insomnia occurs at least three times a week for longer than three months. CAUSES  Insomnia may be caused by another condition, situation, or substance, such as:  Anxiety.  Certain medicines.  Gastroesophageal reflux disease (GERD) or other gastrointestinal conditions.  Asthma or other breathing conditions.  Restless legs syndrome, sleep apnea, or other sleep disorders.  Chronic pain.  Menopause. This may include hot flashes.  Stroke.  Abuse of alcohol, tobacco, or illegal drugs.  Depression.  Caffeine.   Neurological disorders, such as Alzheimer disease.  An overactive thyroid (hyperthyroidism). The cause of insomnia may not be known. RISK FACTORS Risk factors for insomnia include:  Gender. Women are more commonly affected than men.  Age. Insomnia is more common as you get older.  Stress. This may involve your professional or personal life.  Income. Insomnia is more common in people with lower income.  Lack of exercise.   Irregular work schedule or night shifts.  Traveling between different time zones. SIGNS AND SYMPTOMS If you have insomnia, trouble falling asleep or trouble staying asleep is the main symptom. This may lead to other symptoms, such as:  Feeling fatigued.  Feeling nervous about going to sleep.  Not feeling rested in the morning.  Having trouble  concentrating.  Feeling irritable, anxious, or depressed. TREATMENT  Treatment for insomnia depends on the cause. If your insomnia is caused by an underlying condition, treatment will focus on addressing the condition. Treatment may also include:   Medicines to help you sleep.  Counseling or therapy.  Lifestyle adjustments. HOME CARE INSTRUCTIONS   Take medicines only as directed by your health care provider.  Keep regular sleeping and waking hours. Avoid naps.  Keep a sleep diary to help you and your health care provider figure out what could be causing your insomnia. Include:   When you sleep.  When you wake up during the night.  How well you sleep.   How rested you feel the next day.  Any side effects of medicines you are taking.  What you eat and drink.   Make your bedroom a comfortable place where it is easy to fall asleep:  Put up shades or special blackout curtains to block light from outside.  Use a white noise machine to block noise.  Keep the temperature cool.   Exercise regularly as directed by your health care provider. Avoid exercising right before bedtime.  Use relaxation techniques to manage stress. Ask your health care provider to suggest some techniques that may work well for you. These may include:  Breathing exercises.  Routines to release muscle tension.  Visualizing peaceful scenes.  Cut back on alcohol, caffeinated beverages, and cigarettes, especially close to bedtime. These can disrupt your sleep.  Do not overeat or eat spicy foods right before bedtime. This can lead to digestive discomfort that can make it hard for you to sleep.  Limit screen use before bedtime. This includes:  Watching TV.  Using your smartphone, tablet, and computer.  Stick to a routine. This can help you fall asleep faster. Try to do a quiet activity, brush your teeth, and go to bed at the same time each night.  Get out of bed if you are still awake after 15  minutes of trying to sleep. Keep the lights down, but try reading or doing a quiet activity. When you feel sleepy, go back to bed.  Make sure that you drive carefully. Avoid driving if you feel very sleepy.  Keep all follow-up appointments as directed by your health care provider. This is important. SEEK MEDICAL CARE IF:   You are tired throughout the day or have trouble in your daily routine due to sleepiness.  You continue to have sleep problems or your sleep problems get worse. SEEK IMMEDIATE MEDICAL CARE IF:   You have serious thoughts about hurting yourself or someone else.   This information is not intended to replace advice given to you by your health care provider. Make sure you discuss any questions you have with your health care provider.   before you go to bed at night sit down and write down all the things that are on your mind. Give yourself permission to do with it in the morning When you go to bed if you haven't fall asleep within a half an hour or so get up and do something relaxing and then lay back down again and keep doing this but does still get up at the same time Document Released: 09/29/2000 Document Revised: 06/23/2015 Document Reviewed: 07/03/2014 Elsevier Interactive Patient Education 2016 [employer] Go to the American diabetes Association website or Family  doctor.org

## 2016-10-01 ENCOUNTER — Other Ambulatory Visit: Payer: Self-pay | Admitting: Family Medicine

## 2016-12-14 ENCOUNTER — Ambulatory Visit: Payer: BLUE CROSS/BLUE SHIELD | Admitting: Family Medicine

## 2017-01-03 ENCOUNTER — Other Ambulatory Visit: Payer: Self-pay

## 2017-01-03 ENCOUNTER — Telehealth: Payer: Self-pay

## 2017-01-03 MED ORDER — METFORMIN HCL ER 500 MG PO TB24: ORAL_TABLET | ORAL | 1 refills | Status: DC

## 2017-01-03 MED ORDER — ATORVASTATIN CALCIUM 10 MG PO TABS: 10.0000 mg | ORAL_TABLET | Freq: Every day | ORAL | 3 refills | Status: DC

## 2017-01-03 NOTE — Telephone Encounter (Signed)
Needs refill of atorvastatin and metformin called to [facility].

## 2017-01-03 NOTE — Telephone Encounter (Signed)
Sent in metformin and atorvastatin to [facility]

## 2017-04-26 ENCOUNTER — Telehealth: Payer: Self-pay

## 2017-04-26 NOTE — Telephone Encounter (Signed)
Called to inform pt he needs to make his DM check appointment because he cancled his last appointment he has made appointment

## 2017-06-04 ENCOUNTER — Encounter: Payer: Self-pay | Admitting: Family Medicine

## 2017-06-04 ENCOUNTER — Ambulatory Visit (INDEPENDENT_AMBULATORY_CARE_PROVIDER_SITE_OTHER): Payer: BLUE CROSS/BLUE SHIELD | Admitting: Family Medicine

## 2017-06-04 VITALS — BP 122/80 | HR 77 | Wt 232.0 lb

## 2017-06-04 DIAGNOSIS — E118 Type 2 diabetes mellitus with unspecified complications: Secondary | ICD-10-CM

## 2017-06-04 DIAGNOSIS — E785 Hyperlipidemia, unspecified: Secondary | ICD-10-CM

## 2017-06-04 DIAGNOSIS — E669 Obesity, unspecified: Secondary | ICD-10-CM

## 2017-06-04 DIAGNOSIS — E1169 Type 2 diabetes mellitus with other specified complication: Secondary | ICD-10-CM | POA: Diagnosis not present

## 2017-06-04 DIAGNOSIS — Z8719 Personal history of other diseases of the digestive system: Secondary | ICD-10-CM

## 2017-06-04 LAB — POCT GLYCOSYLATED HEMOGLOBIN (HGB A1C): HEMOGLOBIN A1C: 6.2

## 2017-06-04 MED ORDER — LISINOPRIL 5 MG PO TABS: 5.0000 mg | ORAL_TABLET | Freq: Every day | ORAL | 3 refills | Status: DC

## 2017-06-04 NOTE — Progress Notes (Signed)
  Subjective:    Patient ID: Ronald Wiggins, male    DOB: 1975/10/29, 41 y.o.   MRN: [id number]  Ronald Wiggins is a 41 y.o. male who presents for follow-up of Type 2 diabetes mellitus.  Patient is not checking home blood sugars.   Home blood sugar records: BGs consistently in an acceptable range How often is blood sugars being checked: Once or twice a week Current symptoms/problems include none and have been unchanged. Daily foot checks: Yes   Any foot concerns: No Last eye exam: Several years ago. Getting ready to move out of state. Exercise: The patient does not participate in regular exercise at present. At the end of the encounter he then mentioned several episodes of difficulty with bloody stools. He states that this is been going on for over a year and occurs every 3 or 4 months. He has had no vomiting and only minimal abdominal discomfort with this. He is planning to move to the [place] area at the end of September.  The following portions of the patient's history were reviewed and updated as appropriate: allergies, current medications, past medical history, past social history and problem list.  ROS as in subjective above.     Objective:    Physical Exam Alert and in no distress otherwise not examined.  Lab Review Diabetic Labs Latest Ref Rng & Units 06/04/2017 08/15/2016 04/11/2016 06/16/2015 07/16/2014  HbA1c - 6.2 7.4 6.5 6.8 -  Chol 125 - 200 mg/dL - - 767 209 470  HDL >=96 mg/dL - - 28(Z) 66(Q) 94(T)  Calc LDL <130 mg/dL - - NOT CALC NOT CALC NOT CALC  Triglycerides <150 mg/dL - - 654(Y) 503(T) 465(K)  Creatinine 0.60 - 1.35 mg/dL - - 8.12 7.51 -   BP/Weight 06/04/2017 08/15/2016 04/11/2016 07/16/2015 07/09/2015  Systolic BP 122 124 122 136 140  Diastolic BP 80 82 82 90 92  Wt. (Lbs) 232 240 239.2 241 241.6  BMI 36.34 37.59 37.46 36.65 36.74   A1c is 6.2  Mana  reports that he has quit smoking. His smoking use included Cigarettes. He smoked 0.10 packs per day. He has  never used smokeless tobacco. He reports that he drinks alcohol. He reports that he does not use drugs.     Assessment & Plan:    Type 2 diabetes mellitus with complication, without long-term current use of insulin (HCC) - Plan: lisinopril (PRINIVIL,ZESTRIL) 5 MG tablet, POCT glycosylated hemoglobin (Hb A1C)  Hyperlipidemia associated with type 2 diabetes mellitus (HCC)  Obesity (BMI 30-39.9)  History of bloody stools  1. Rx changes: Lisinopril added to his regimen 2. Education: Reviewed 'ABCs' of diabetes management (respective goals in parentheses):  A1C (<7), blood pressure (<130/80), and cholesterol (LDL <100). 3. Compliance at present is estimated to be good. Efforts to improve compliance (if necessary) will be directed at increased exercise. 4. Follow up: Since he has moving in the near future, recommend that he find a physician in the [place] area to follow-up on his diabetes. He has started to slowly lose weight which is good. Lisinopril for kidney protection. Also his history of bloody stools needs to followed up on. Discussed the possible diagnosis of Crohn's disease versus UC. He explains that he needs to follow-up on this relatively quickly to potentially avoid any complications. He is aware of this.

## 2017-06-09 ENCOUNTER — Other Ambulatory Visit: Payer: Self-pay | Admitting: Family Medicine

## 2017-12-08 ENCOUNTER — Other Ambulatory Visit: Payer: Self-pay | Admitting: Family Medicine

## 2017-12-09 ENCOUNTER — Other Ambulatory Visit: Payer: Self-pay | Admitting: Family Medicine

## 2017-12-10 ENCOUNTER — Telehealth: Payer: Self-pay

## 2017-12-10 NOTE — Telephone Encounter (Signed)
Left message for pt to call and make a daibetes check appt. Pt med were only filled for 30 days and will need to have a appt soon. Thanks [facility]

## 2017-12-20 ENCOUNTER — Telehealth: Payer: Self-pay

## 2017-12-20 NOTE — Telephone Encounter (Signed)
Called pt to schedule a diabetes check . Pt last visit was 06-04-17. No answer and left a voice mail to call [facility]

## 2018-08-05 ENCOUNTER — Other Ambulatory Visit: Payer: Self-pay | Admitting: Family Medicine

## 2019-10-20 ENCOUNTER — Ambulatory Visit: Payer: Self-pay | Attending: Internal Medicine

## 2020-01-01 NOTE — Progress Notes (Signed)
  Subjective:    Patient ID: Ronald Wiggins, male    DOB: Dec 18, 1975, 44 y.o.   MRN: [id number]  Ronald Wiggins is a 44 y.o. male who presents for follow-up of Type 2 diabetes mellitus. He just recently moved back to this area from [place].  He was being followed in [place] by another physician. Home blood sugar records: not checking  Current symptoms/problems include none at this time. Daily foot checks: yes   Any foot concerns: none Exercise: walking 5 miles per day. Diet: healthy  He is taking atorvastatin and having no aches or pains with that.  His also taking Metformin and having no GI issues with that.  Takes lisinopril with no cough or swelling issues.  He is a former smoker. The following portions of the patient's history were reviewed and updated as appropriate: allergies, current medications, past medical history, past social history and problem list.  ROS as in subjective above.     Objective:    Physical Exam Alert and in no distress otherwise not examined. Hemoglobin A1c is 8.8. Lab Review Diabetic Labs Latest Ref Rng & Units 06/04/2017 08/15/2016 04/11/2016 06/16/2015 07/16/2014  HbA1c - 6.2 7.4 6.5 6.8 -  Chol 125 - 200 mg/dL - - 665 993 570  HDL >=17 mg/dL - - 79(T) 90(Z) 00(P)  Calc LDL <130 mg/dL - - NOT CALC NOT CALC NOT CALC  Triglycerides <150 mg/dL - - 233(A) 076(A) 263(F)  Creatinine 0.60 - 1.35 mg/dL - - 3.54 5.62 -   BP/Weight 06/04/2017 08/15/2016 04/11/2016 07/16/2015 07/09/2015  Systolic BP 122 124 122 136 140  Diastolic BP 80 82 82 90 92  Wt. (Lbs) 232 240 239.2 241 241.6  BMI 36.34 37.59 37.46 36.65 36.74    Eean  reports that he has quit smoking. His smoking use included cigarettes. He smoked 0.10 packs per day. He has never used smokeless tobacco. He reports current alcohol use. He reports that he does not use drugs.     Assessment & Plan:    Type 2 diabetes mellitus with complication, without long-term current use of insulin (HCC) - Plan: POCT  glycosylated hemoglobin (Hb A1C), dapagliflozin propanediol (FARXIGA) 5 MG TABS tablet, CBC with Differential/Platelet, Comprehensive metabolic panel, Lipid panel, POCT UA - Microalbumin, lisinopril (ZESTRIL) 5 MG tablet, metFORMIN (GLUCOPHAGE-XR) 500 MG 24 hr tablet  Hyperlipidemia associated with type 2 diabetes mellitus (HCC) - Plan: Lipid panel, atorvastatin (LIPITOR) 10 MG tablet  Obesity (BMI 30-39.9)  Need for Tdap vaccination - Plan: Tdap vaccine greater than or equal to 7yo IM  Former smoker I discussed his medication use with him.  We will also set him up to get a glucometer.  Discussed various options concerning his hemoglobin A1c and treatment.  I will place him on Farxiga.  Discussed possible side effects and benefits for this medication.  Also since he is doing a good job with his exercise, recommend working harder on reducing his carbohydrates.  He is to return here in 4 months for complete exam.

## 2020-01-02 ENCOUNTER — Encounter: Payer: Self-pay | Admitting: Family Medicine

## 2020-01-02 ENCOUNTER — Other Ambulatory Visit: Payer: Self-pay

## 2020-01-02 ENCOUNTER — Ambulatory Visit (INDEPENDENT_AMBULATORY_CARE_PROVIDER_SITE_OTHER): Payer: No Typology Code available for payment source | Admitting: Family Medicine

## 2020-01-02 VITALS — BP 116/82 | HR 85 | Temp 96.8°F | Ht 67.75 in | Wt 211.8 lb

## 2020-01-02 DIAGNOSIS — Z23 Encounter for immunization: Secondary | ICD-10-CM | POA: Diagnosis not present

## 2020-01-02 DIAGNOSIS — E118 Type 2 diabetes mellitus with unspecified complications: Secondary | ICD-10-CM

## 2020-01-02 DIAGNOSIS — E1169 Type 2 diabetes mellitus with other specified complication: Secondary | ICD-10-CM

## 2020-01-02 DIAGNOSIS — I152 Hypertension secondary to endocrine disorders: Secondary | ICD-10-CM | POA: Insufficient documentation

## 2020-01-02 DIAGNOSIS — E1159 Type 2 diabetes mellitus with other circulatory complications: Secondary | ICD-10-CM | POA: Insufficient documentation

## 2020-01-02 DIAGNOSIS — E785 Hyperlipidemia, unspecified: Secondary | ICD-10-CM

## 2020-01-02 DIAGNOSIS — E669 Obesity, unspecified: Secondary | ICD-10-CM

## 2020-01-02 DIAGNOSIS — Z87891 Personal history of nicotine dependence: Secondary | ICD-10-CM

## 2020-01-02 LAB — POCT UA - MICROALBUMIN
Albumin/Creatinine Ratio, Urine, POC: 17.1
Creatinine, POC: 255.7 mg/dL
Microalbumin Ur, POC: 43.6 mg/L

## 2020-01-02 LAB — POCT GLYCOSYLATED HEMOGLOBIN (HGB A1C): Hemoglobin A1C: 8.8 % — AB (ref 4.0–5.6)

## 2020-01-02 MED ORDER — METFORMIN HCL ER 500 MG PO TB24: ORAL_TABLET | ORAL | 0 refills | Status: DC

## 2020-01-02 MED ORDER — FARXIGA 5 MG PO TABS: 5.0000 mg | ORAL_TABLET | Freq: Every day | ORAL | 1 refills | Status: DC

## 2020-01-02 MED ORDER — ATORVASTATIN CALCIUM 10 MG PO TABS: 10.0000 mg | ORAL_TABLET | Freq: Every day | ORAL | 3 refills | Status: DC

## 2020-01-02 MED ORDER — LISINOPRIL 5 MG PO TABS: 5.0000 mg | ORAL_TABLET | Freq: Every day | ORAL | 3 refills | Status: DC

## 2020-01-02 NOTE — Patient Instructions (Signed)
Cut back on carbohydrates and the easiest way to remember that is bread rice pasta potatoes sugar

## 2020-01-03 LAB — LIPID PANEL
Chol/HDL Ratio: 4.4 ratio (ref 0.0–5.0)
Cholesterol, Total: 146 mg/dL (ref 100–199)
HDL: 33 mg/dL — ABNORMAL LOW (ref >39)
LDL Chol Calc (NIH): 79 mg/dL (ref 0–99)
Triglycerides: 199 mg/dL — ABNORMAL HIGH (ref 0–149)
VLDL Cholesterol Cal: 34 mg/dL (ref 5–40)

## 2020-01-03 LAB — COMPREHENSIVE METABOLIC PANEL
ALT: 32 IU/L (ref 0–44)
AST: 22 IU/L (ref 0–40)
Albumin/Globulin Ratio: 1.8 (ref 1.2–2.2)
Albumin: 4.9 g/dL (ref 4.0–5.0)
Alkaline Phosphatase: 60 IU/L (ref 39–117)
BUN/Creatinine Ratio: 8 — ABNORMAL LOW (ref 9–20)
BUN: 6 mg/dL (ref 6–24)
Bilirubin Total: 0.9 mg/dL (ref 0.0–1.2)
CO2: 20 mmol/L (ref 20–29)
Calcium: 9.6 mg/dL (ref 8.7–10.2)
Chloride: 99 mmol/L (ref 96–106)
Creatinine, Ser: 0.79 mg/dL (ref 0.76–1.27)
GFR calc Af Amer: 129 mL/min/{1.73_m2} (ref >59)
GFR calc non Af Amer: 112 mL/min/{1.73_m2} (ref >59)
Globulin, Total: 2.7 g/dL (ref 1.5–4.5)
Glucose: 197 mg/dL — ABNORMAL HIGH (ref 65–99)
Potassium: 4.4 mmol/L (ref 3.5–5.2)
Sodium: 137 mmol/L (ref 134–144)
Total Protein: 7.6 g/dL (ref 6.0–8.5)

## 2020-01-03 LAB — CBC WITH DIFFERENTIAL/PLATELET
Basophils Absolute: 0 10*3/uL (ref 0.0–0.2)
Basos: 1 %
EOS (ABSOLUTE): 0.2 10*3/uL (ref 0.0–0.4)
Eos: 2 %
Hematocrit: 43.2 % (ref 37.5–51.0)
Hemoglobin: 15.1 g/dL (ref 13.0–17.7)
Immature Grans (Abs): 0 10*3/uL (ref 0.0–0.1)
Immature Granulocytes: 0 %
Lymphocytes Absolute: 2.6 10*3/uL (ref 0.7–3.1)
Lymphs: 35 %
MCH: 30.8 pg (ref 26.6–33.0)
MCHC: 35 g/dL (ref 31.5–35.7)
MCV: 88 fL (ref 79–97)
Monocytes Absolute: 0.5 10*3/uL (ref 0.1–0.9)
Monocytes: 7 %
Neutrophils Absolute: 4.1 10*3/uL (ref 1.4–7.0)
Neutrophils: 55 %
Platelets: 183 10*3/uL (ref 150–450)
RBC: 4.91 x10E6/uL (ref 4.14–5.80)
RDW: 13 % (ref 11.6–15.4)
WBC: 7.4 10*3/uL (ref 3.4–10.8)

## 2020-01-05 ENCOUNTER — Telehealth: Payer: Self-pay | Admitting: Family Medicine

## 2020-01-05 NOTE — Telephone Encounter (Signed)
Pt called and states that all his RX needs to be changed to [facility], lipitor, farxiga, glucose one touch,lisinopril, metformin,lancets,

## 2020-01-07 ENCOUNTER — Other Ambulatory Visit: Payer: Self-pay

## 2020-01-07 DIAGNOSIS — E1169 Type 2 diabetes mellitus with other specified complication: Secondary | ICD-10-CM

## 2020-01-07 DIAGNOSIS — E118 Type 2 diabetes mellitus with unspecified complications: Secondary | ICD-10-CM

## 2020-01-07 MED ORDER — ONETOUCH DELICA LANCETS 30G MISC: 1.0000 | 2 refills | Status: AC

## 2020-01-07 MED ORDER — FARXIGA 5 MG PO TABS: 5.0000 mg | ORAL_TABLET | Freq: Every day | ORAL | 1 refills | Status: DC

## 2020-01-07 MED ORDER — LISINOPRIL 5 MG PO TABS: 5.0000 mg | ORAL_TABLET | Freq: Every day | ORAL | 3 refills | Status: DC

## 2020-01-07 MED ORDER — METFORMIN HCL ER 500 MG PO TB24: ORAL_TABLET | ORAL | 0 refills | Status: DC

## 2020-01-07 MED ORDER — ATORVASTATIN CALCIUM 10 MG PO TABS: 10.0000 mg | ORAL_TABLET | Freq: Every day | ORAL | 3 refills | Status: DC

## 2020-01-07 MED ORDER — BLOOD GLUCOSE TEST VI STRP: 1.0000 | ORAL_STRIP | Freq: Two times a day (BID) | 2 refills | Status: AC

## 2020-01-07 NOTE — Telephone Encounter (Signed)
Med have been [facility] per pt request. LVM advising pt. KH

## 2020-02-11 ENCOUNTER — Telehealth: Payer: Self-pay

## 2020-02-11 MED ORDER — JARDIANCE 10 MG PO TABS: 10.0000 mg | ORAL_TABLET | Freq: Every day | ORAL | 5 refills | Status: DC

## 2020-02-11 NOTE — Telephone Encounter (Signed)
Ronald Wiggins is not covered please advise if jardiance can be sent in . Thanks kh

## 2020-02-11 NOTE — Telephone Encounter (Signed)
I discontinued Marcelline Deist and called in Maunie to the [facility] on [address]

## 2020-02-11 NOTE — Addendum Note (Signed)
Addended by: Ronnald Nian on: 02/11/2020 10:37 AM   Modules accepted: Orders

## 2020-02-11 NOTE — Addendum Note (Signed)
Addended by: Ronnald Nian on: 02/11/2020 10:36 AM   Modules accepted: Orders

## 2020-02-12 NOTE — Telephone Encounter (Signed)
Pt was advised KH 

## 2020-02-19 ENCOUNTER — Encounter: Payer: Self-pay | Admitting: Family Medicine

## 2020-02-19 ENCOUNTER — Ambulatory Visit: Payer: No Typology Code available for payment source | Admitting: Family Medicine

## 2020-02-19 ENCOUNTER — Other Ambulatory Visit: Payer: Self-pay

## 2020-02-19 VITALS — BP 122/86 | HR 86 | Temp 97.7°F | Wt 215.2 lb

## 2020-02-19 DIAGNOSIS — F329 Major depressive disorder, single episode, unspecified: Secondary | ICD-10-CM | POA: Diagnosis not present

## 2020-02-19 DIAGNOSIS — F32A Depression, unspecified: Secondary | ICD-10-CM | POA: Insufficient documentation

## 2020-02-19 DIAGNOSIS — F419 Anxiety disorder, unspecified: Secondary | ICD-10-CM

## 2020-02-19 DIAGNOSIS — L0100 Impetigo, unspecified: Secondary | ICD-10-CM | POA: Diagnosis not present

## 2020-02-19 DIAGNOSIS — B356 Tinea cruris: Secondary | ICD-10-CM | POA: Diagnosis not present

## 2020-02-19 MED ORDER — DOXYCYCLINE HYCLATE 100 MG PO TABS: 100.0000 mg | ORAL_TABLET | Freq: Two times a day (BID) | ORAL | 0 refills | Status: DC

## 2020-02-19 MED ORDER — VORTIOXETINE HBR 5 MG PO TABS: 5.0000 mg | ORAL_TABLET | Freq: Every day | ORAL | 0 refills | Status: DC

## 2020-02-19 MED ORDER — FLUCONAZOLE 100 MG PO TABS: 100.0000 mg | ORAL_TABLET | Freq: Every day | ORAL | 0 refills | Status: DC

## 2020-02-19 NOTE — Progress Notes (Signed)
   Subjective:    Patient ID: Ronald Wiggins, male    DOB: 06-13-1976, 44 y.o.   MRN: [id number]  HPI He is here for evaluation of rash in his inguinal area.  He has been using Lotrimin with little success.  He complains of itching and spread onto the scrotum and down on the inner thigh. At the end of the encounter he then mentioned the fact that he has had difficulty practically his whole life with anxiety and depression.  He apparently has been tried on multiple medicines in the past including Effexor, Zoloft, Paxil, Cymbalta, none of which have worked really well.  He cites life stresses is causing this.  He is not suicidal.  He is looking for some guidance in this and possibly starting on medication.   Review of Systems     Objective:   Physical Exam Alert and in no distress.  He did become tearful when we were talking about the depression.  Inguinal exam does show multiple erythematous raised lesions with central clearing some of which appear satellite in nature.  Some the lesions are on the scrotum.       Assessment & Plan:  Tinea cruris - Plan: fluconazole (DIFLUCAN) 100 MG tablet  Impetigo - Plan: doxycycline (VIBRA-TABS) 100 MG tablet  Anxiety and depression - Plan: vortioxetine HBr (TRINTELLIX) 5 MG TABS tablet He is to use the antibiotic and the antifungal medicine as directed.  He is also to use Lamisil AF.  He will call if continued difficulty. I then discussed the depression with him.  He is not sure whether he wants to go on medication or not.  I recommended that he start take better care of himself in regard to do something fun and positive including exercise on a regular basis.  Also strongly encouraged to get involved in counseling.  Did give him a sample of the Trintellix.  He is not sure whether he is going to take or not.  If he starts he is to call me back

## 2020-02-19 NOTE — Patient Instructions (Signed)
Use Lamisil AF topically.  Take the medications and see what that does to clear if not then let me know start exercising and get involved in counseling Emotions are neither right nor wrong they just are

## 2020-05-03 ENCOUNTER — Other Ambulatory Visit: Payer: Self-pay

## 2020-05-04 ENCOUNTER — Encounter: Payer: No Typology Code available for payment source | Admitting: Family Medicine

## 2020-05-04 ENCOUNTER — Telehealth: Payer: Self-pay

## 2020-05-04 DIAGNOSIS — E118 Type 2 diabetes mellitus with unspecified complications: Secondary | ICD-10-CM

## 2020-05-04 DIAGNOSIS — E785 Hyperlipidemia, unspecified: Secondary | ICD-10-CM

## 2020-05-04 MED ORDER — LISINOPRIL 5 MG PO TABS: 5.0000 mg | ORAL_TABLET | Freq: Every day | ORAL | 0 refills | Status: DC

## 2020-05-04 MED ORDER — METFORMIN HCL ER 500 MG PO TB24
ORAL_TABLET | ORAL | 0 refills | Status: DC
Start: 2020-05-04 — End: 2020-08-02

## 2020-05-04 MED ORDER — ATORVASTATIN CALCIUM 10 MG PO TABS: 10.0000 mg | ORAL_TABLET | Freq: Every day | ORAL | 0 refills | Status: DC

## 2020-05-04 NOTE — Telephone Encounter (Signed)
Med was sent in to cover pt while he is between jobs [facility]

## 2020-06-18 ENCOUNTER — Other Ambulatory Visit: Payer: Self-pay

## 2020-08-02 ENCOUNTER — Telehealth: Payer: Self-pay

## 2020-08-02 ENCOUNTER — Other Ambulatory Visit: Payer: Self-pay | Admitting: Family Medicine

## 2020-08-02 DIAGNOSIS — E118 Type 2 diabetes mellitus with unspecified complications: Secondary | ICD-10-CM

## 2020-08-02 DIAGNOSIS — E1169 Type 2 diabetes mellitus with other specified complication: Secondary | ICD-10-CM

## 2020-08-02 NOTE — Telephone Encounter (Signed)
LVM for pt to call back for an appointment for diabetes check. Will fill med for 30 days . KH

## 2020-08-25 ENCOUNTER — Ambulatory Visit: Payer: BC Managed Care – PPO | Admitting: Family Medicine

## 2020-08-25 ENCOUNTER — Other Ambulatory Visit: Payer: Self-pay

## 2020-08-25 VITALS — BP 162/98 | HR 74 | Temp 98.7°F | Wt 210.0 lb

## 2020-08-25 DIAGNOSIS — F32A Depression, unspecified: Secondary | ICD-10-CM

## 2020-08-25 DIAGNOSIS — F419 Anxiety disorder, unspecified: Secondary | ICD-10-CM | POA: Diagnosis not present

## 2020-08-25 MED ORDER — VORTIOXETINE HBR 10 MG PO TABS: 10.0000 mg | ORAL_TABLET | Freq: Every day | ORAL | 0 refills | Status: DC

## 2020-08-25 MED ORDER — ALPRAZOLAM 0.25 MG PO TABS: 0.2500 mg | ORAL_TABLET | Freq: Two times a day (BID) | ORAL | 0 refills | Status: DC | PRN

## 2020-08-25 NOTE — Progress Notes (Signed)
   Subjective:    Patient ID: KARTHIKEYA FUNKE, male    DOB: [birth year], 44 y.o.   MRN: [id number]  HPI He is here for further discussion of difficulty with his anxiety.  He was seen in May and given Trintellix however he did not start until several days ago.  He continues to have difficulty with work-related stress.  Also his 34 year old daughter has apparently been having some psychological issues and recently got out of the hospital.  He complains of anhedonia anxiety, crying, being overwhelmed, having racing thoughts but he is not suicidal.  His alcohol consumption is minimal.   Review of Systems     Objective:   Physical Exam Alert and quite tearful.  Dressed appropriately.  Affect is appropriate.       Assessment & Plan:  Anxiety and depression - Plan: ALPRAZolam (XANAX) 0.25 MG tablet, vortioxetine HBr (TRINTELLIX) 10 MG TABS tablet He is to continue on his Trintellix and go to the 10 mg.  Did give him Xanax to help with the anxiety.  Gave him the number for the [place] counseling as well as Darryl Hyers.  He is to set up a virtual visit with me in 1 month.

## 2020-08-25 NOTE — Patient Instructions (Signed)
Ronald Wiggins [phone]. [facility] [phone]

## 2020-09-03 ENCOUNTER — Ambulatory Visit (INDEPENDENT_AMBULATORY_CARE_PROVIDER_SITE_OTHER): Payer: BC Managed Care – PPO | Admitting: Family Medicine

## 2020-09-03 ENCOUNTER — Other Ambulatory Visit: Payer: Self-pay

## 2020-09-03 ENCOUNTER — Encounter: Payer: Self-pay | Admitting: Family Medicine

## 2020-09-03 VITALS — BP 136/84 | HR 86 | Temp 98.5°F | Wt 209.0 lb

## 2020-09-03 DIAGNOSIS — F419 Anxiety disorder, unspecified: Secondary | ICD-10-CM | POA: Diagnosis not present

## 2020-09-03 DIAGNOSIS — Z23 Encounter for immunization: Secondary | ICD-10-CM | POA: Diagnosis not present

## 2020-09-03 DIAGNOSIS — E785 Hyperlipidemia, unspecified: Secondary | ICD-10-CM

## 2020-09-03 DIAGNOSIS — E118 Type 2 diabetes mellitus with unspecified complications: Secondary | ICD-10-CM | POA: Diagnosis not present

## 2020-09-03 DIAGNOSIS — E1169 Type 2 diabetes mellitus with other specified complication: Secondary | ICD-10-CM

## 2020-09-03 DIAGNOSIS — I152 Hypertension secondary to endocrine disorders: Secondary | ICD-10-CM

## 2020-09-03 DIAGNOSIS — E1159 Type 2 diabetes mellitus with other circulatory complications: Secondary | ICD-10-CM | POA: Diagnosis not present

## 2020-09-03 DIAGNOSIS — F32A Depression, unspecified: Secondary | ICD-10-CM

## 2020-09-03 LAB — POCT GLYCOSYLATED HEMOGLOBIN (HGB A1C): Hemoglobin A1C: 5.8 % — AB (ref 4.0–5.6)

## 2020-09-03 MED ORDER — ALPRAZOLAM 0.25 MG PO TABS: 0.2500 mg | ORAL_TABLET | Freq: Two times a day (BID) | ORAL | 0 refills | Status: DC | PRN

## 2020-09-03 MED ORDER — CITALOPRAM HYDROBROMIDE 20 MG PO TABS: 20.0000 mg | ORAL_TABLET | Freq: Every day | ORAL | 3 refills | Status: DC

## 2020-09-03 NOTE — Progress Notes (Signed)
°  Subjective:    Patient ID: Ronald Wiggins, male    DOB: 12-15-1975, 44 y.o.   MRN: [id number]  Ronald Wiggins is a 44 y.o. male who presents for follow-up of Type 2 diabetes mellitus.  Home blood sugar records: meter record ,post meal, 120-149 Current symptoms/problems include none at this time. Daily foot checks: yes   Any foot concerns: none at this time Exercise: staying active  Diet:fair he admits to not having good eating habits. He has been having a lot of difficulty with nausea from the Trintellix.  He has been using Xanax usually just once per day and tries hold off taking the medication although he admits that he definitely needs it.  He psychologically feels as if he is just barely hanging on.  He has not had the energy to make the phone call for counseling.  He continues on atorvastatin, lisinopril and Metformin and having no difficulty with them. The following portions of the patient's history were reviewed and updated as appropriate: allergies, current medications, past medical history, past social history and problem list.  ROS as in subjective above.     Objective:    Physical Exam Alert and in no distress.  He did become tearful concerning dealing with great is psychologically. Hemoglobin A1c is 5.7  Lab Review Diabetic Labs Latest Ref Rng & Units 01/02/2020 06/04/2017 08/15/2016 04/11/2016 06/16/2015  HbA1c 4.0 - 5.6 % 8.8(A) 6.2 7.4 6.5 6.8  Microalbumin mg/L 43.6 - - - -  Micro/Creat Ratio - 17.1 - - - -  Chol 100 - 199 mg/dL 366 - - 294 765  HDL >46 mg/dL 50(P) - - 54(S) 56(C)  Calc LDL 0 - 99 mg/dL 79 - - NOT CALC NOT CALC  Triglycerides 0 - 149 mg/dL 127(N) - - 170(Y) 174(B)  Creatinine 0.76 - 1.27 mg/dL 4.49 - - 6.75 9.16   BP/Weight 08/25/2020 02/19/2020 01/02/2020 06/04/2017 08/15/2016  Systolic BP 162 122 116 122 124  Diastolic BP 98 86 82 80 82  Wt. (Lbs) 210 215.2 211.8 232 240  BMI 32.17 32.96 32.44 36.34 37.59    Ancil  reports that he has quit  smoking. His smoking use included cigarettes. He smoked 0.10 packs per day. He has never used smokeless tobacco. He reports current alcohol use. He reports that he does not use drugs.     Assessment & Plan:    Type 2 diabetes mellitus with complication, without long-term current use of insulin (HCC) - Plan: POCT glycosylated hemoglobin (Hb A1C), Ambulatory referral to Ophthalmology  Anxiety and depression - Plan: ALPRAZolam (XANAX) 0.25 MG tablet, citalopram (CELEXA) 20 MG tablet  Hyperlipidemia associated with type 2 diabetes mellitus (HCC)  Hypertension associated with diabetes (HCC)  Need for influenza vaccination - Plan: Flu Vaccine QUAD 36+ mos IM   1. Rx changes: Stop the Trintellix and start taking Celexa.  Encouraged to use Xanax more often to help with anxiety.  Also cut back on Metformin to 1 pill/day 2. Education: Reviewed ABCs of diabetes management (respective goals in parentheses):  A1C (<7), blood pressure (<130/80), and cholesterol (LDL <100). 3. Compliance at present is estimated to be fair. Efforts to improve compliance (if necessary) will be directed at Continue present regimen. 4. Follow up: 4 months.  He is however to set up a virtual visit for counseling in 1 month.

## 2020-09-03 NOTE — Patient Instructions (Addendum)
Take 1 Metformin per day, stop the Trintellix and start on the citalopram.

## 2020-09-13 ENCOUNTER — Ambulatory Visit (INDEPENDENT_AMBULATORY_CARE_PROVIDER_SITE_OTHER): Payer: BC Managed Care – PPO | Admitting: Psychology

## 2020-09-13 DIAGNOSIS — F411 Generalized anxiety disorder: Secondary | ICD-10-CM

## 2020-09-20 ENCOUNTER — Telehealth: Payer: BC Managed Care – PPO | Admitting: Family Medicine

## 2020-09-20 ENCOUNTER — Other Ambulatory Visit: Payer: Self-pay

## 2020-09-20 ENCOUNTER — Ambulatory Visit (HOSPITAL_COMMUNITY)
Admission: EM | Admit: 2020-09-20 | Discharge: 2020-09-20 | Disposition: A | Discharge status: Home / Self Care | Payer: BC Managed Care – PPO

## 2020-09-20 ENCOUNTER — Encounter: Payer: Self-pay | Admitting: Family Medicine

## 2020-09-20 VITALS — Wt 210.0 lb

## 2020-09-20 DIAGNOSIS — F419 Anxiety disorder, unspecified: Secondary | ICD-10-CM | POA: Diagnosis not present

## 2020-09-20 DIAGNOSIS — F32A Depression, unspecified: Secondary | ICD-10-CM | POA: Diagnosis not present

## 2020-09-20 MED ORDER — ALPRAZOLAM 0.5 MG PO TABS: 0.5000 mg | ORAL_TABLET | Freq: Every evening | ORAL | 0 refills | Status: DC | PRN

## 2020-09-20 MED ORDER — CITALOPRAM HYDROBROMIDE 40 MG PO TABS: 40.0000 mg | ORAL_TABLET | Freq: Every day | ORAL | 3 refills | Status: DC

## 2020-09-20 NOTE — Progress Notes (Signed)
Received Ronald Wiggins in the [facility] with his wife. His chief compliant was increased anxiety, unable to sleep, bad day yesterday and he does not like his job. He is a patient at a [facility] under the care of Dr. Susann Givens. He is here this morning  requesting someone to look at his medications and talk with a provider. His medications consists of Xanax and Celexa. He stated a medical hx of diabetes  HTN and increased cholesterol. He denied feeling SI/HI.

## 2020-09-20 NOTE — Progress Notes (Signed)
   Subjective:    Patient ID: Ronald Wiggins, male    DOB: Feb 24, 1976, 44 y.o.   MRN: [id number]  HPI I connected with  Ronald Wiggins on 09/20/20 by a video enabled telemedicine application and verified that I am speaking with the correct person using two identifiers.  Caregility used.  I am in my office.  He is at home. I discussed the limitations of evaluation and management by telemedicine. The patient expressed understanding and agreed to proceed. He has had a great deal of difficulty dealing with his anxiety and depression.  He states that the Xanax is only minimally helping.  He has been taking Celexa.  He has had 1 visit to his therapist however did go to mental health emergency today however the could not see him since he had regular insurance.  He called me because of continued difficulty and the fact that he has missed a couple days of work.  He is scheduled to see his therapist on December 16.  He expressed no suicidal ideation.   Review of Systems     Objective:   Physical Exam Alert and with a concerned look on his face.  Could not tell if he was tearful.       Assessment & Plan:  Anxiety and depression - Plan: ALPRAZolam (XANAX) 0.5 MG tablet, citalopram (CELEXA) 40 MG tablet He will continue to be involved in counseling.  He is to double up on his present Xanax dosing to 0.5 mg and I called in a 0.5 mg.  We will also increase his Celexa.  Encouraged him to call me if he has any difficulty. 22 minutes spent in counseling and coordination of care.

## 2020-09-21 ENCOUNTER — Encounter: Payer: Self-pay | Admitting: Family Medicine

## 2020-09-27 ENCOUNTER — Encounter: Payer: Self-pay | Admitting: Family Medicine

## 2020-09-27 ENCOUNTER — Telehealth: Payer: BC Managed Care – PPO | Admitting: Family Medicine

## 2020-09-27 ENCOUNTER — Other Ambulatory Visit: Payer: Self-pay

## 2020-09-27 VITALS — Ht 67.0 in | Wt 210.0 lb

## 2020-09-27 DIAGNOSIS — F32A Depression, unspecified: Secondary | ICD-10-CM | POA: Diagnosis not present

## 2020-09-27 DIAGNOSIS — F419 Anxiety disorder, unspecified: Secondary | ICD-10-CM | POA: Diagnosis not present

## 2020-09-27 NOTE — Progress Notes (Signed)
   Subjective:    Patient ID: AVRAM DANIELSON, male    DOB: April 25, 1976, 44 y.o.   MRN: [id number]  HPI I connected with  NASIAH LEHENBAUER on 09/27/20 by a video enabled telemedicine application and verified that I am speaking with the correct person using two identifiers.  caregility used.  I am in my office.  Patient at home. I discussed the limitations of evaluation and management by telemedicine. The patient expressed understanding and agreed to proceed. He states he is feeling slightly better although he has not gone to work this week.  He tried to quit but his boss would not let him.  He has had 1 visit with Everlene Other and has another one scheduled on Thursday.  He has been using Xanax but very sparingly.  Review of Systems     Objective:   Physical Exam Alert and in no distress with a flat affect.       Assessment & Plan:  Anxiety and depression Encouraged him to use his Xanax more often to help with his anxiety and encouraged him to go into work today.  He is to take a Xanax before he goes and.  Strongly encouraged him to give himself positive goals to reach with the idea of getting his mind off of the anxiety that he is under.  He has also discussed this further with his therapist.  I plan to recheck him in 1 month.  Approximately 22 minutes spent in counseling coordination of care and review of medical record today.

## 2020-09-30 ENCOUNTER — Ambulatory Visit (INDEPENDENT_AMBULATORY_CARE_PROVIDER_SITE_OTHER): Payer: BC Managed Care – PPO | Admitting: Psychology

## 2020-09-30 DIAGNOSIS — F330 Major depressive disorder, recurrent, mild: Secondary | ICD-10-CM

## 2020-09-30 DIAGNOSIS — F411 Generalized anxiety disorder: Secondary | ICD-10-CM

## 2020-10-14 ENCOUNTER — Ambulatory Visit (INDEPENDENT_AMBULATORY_CARE_PROVIDER_SITE_OTHER): Payer: BC Managed Care – PPO | Admitting: Psychology

## 2020-10-14 DIAGNOSIS — F330 Major depressive disorder, recurrent, mild: Secondary | ICD-10-CM | POA: Diagnosis not present

## 2020-10-14 DIAGNOSIS — F411 Generalized anxiety disorder: Secondary | ICD-10-CM

## 2020-10-21 ENCOUNTER — Other Ambulatory Visit: Payer: Self-pay

## 2020-10-21 ENCOUNTER — Ambulatory Visit (INDEPENDENT_AMBULATORY_CARE_PROVIDER_SITE_OTHER): Payer: BC Managed Care – PPO | Admitting: Family Medicine

## 2020-10-21 ENCOUNTER — Encounter: Payer: Self-pay | Admitting: Family Medicine

## 2020-10-21 VITALS — BP 116/72 | HR 69 | Temp 98.3°F | Wt 209.8 lb

## 2020-10-21 DIAGNOSIS — F419 Anxiety disorder, unspecified: Secondary | ICD-10-CM

## 2020-10-21 DIAGNOSIS — F32A Depression, unspecified: Secondary | ICD-10-CM | POA: Diagnosis not present

## 2020-10-21 DIAGNOSIS — Z3169 Encounter for other general counseling and advice on procreation: Secondary | ICD-10-CM | POA: Diagnosis not present

## 2020-10-21 MED ORDER — VIIBRYD STARTER PACK 10 & 20 MG PO KIT: PACK | ORAL | 0 refills | Status: DC

## 2020-10-21 NOTE — Progress Notes (Signed)
   Subjective:    Patient ID: Ronald Wiggins, male    DOB: 06-Mar-1976, 45 y.o.   MRN: [id number]  HPI He is here for consult concerning continued difficulty with anxiety and depression.  He states that the Celexa is really only made him maybe 40% better.  He continues in counseling and has had 3 sessions with the therapist.  He also has been trying to make positive goals for himself but still having difficulty with that.  He is interested in switching medications.  In the past he was given Trintellix but had unacceptable side effects from it. He also states that he and his wife have been trying to conceive a child for the last 5 or so years and been unable to.  There have been a gaps in this evaluation due to his lack of insurance and he would like to get started on this again.   Review of Systems     Objective:   Physical Exam Alert and in no distress otherwise not examined       Assessment & Plan:  Anxiety and depression  Infertility counseling Since he is not done very well on this dosing, I will stop the Celexa. Take half the Celexa for a week then cut it in half again and take a quarter of the Celexa for a week then stop.  When you are down to a quarter of the Celexa then start on the new medication.  Use your Xanax as needed and set up a virtual visit in a month   I did discuss the infertility with him.  Explained that I did not think was a good idea for him to be trying to conceive a child with his present psychological state but he feels trapped to do so as his wife is 66 years younger than he is. 33 minutes spent in counseling and coordination of care.

## 2020-10-21 NOTE — Patient Instructions (Addendum)
Take half the Celexa for a week then cut it in half again and take a quarter of the Celexa for a week then stop.  When you are down to a quarter of the Celexa then start on the new medication.  Use your Xanax as needed and set up a virtual visit in a month

## 2020-10-23 ENCOUNTER — Other Ambulatory Visit: Payer: Self-pay | Admitting: Family Medicine

## 2020-10-23 ENCOUNTER — Telehealth: Payer: Self-pay

## 2020-10-23 DIAGNOSIS — F419 Anxiety disorder, unspecified: Secondary | ICD-10-CM

## 2020-10-23 NOTE — Telephone Encounter (Signed)
P.A. Lorin Picket

## 2020-10-25 ENCOUNTER — Other Ambulatory Visit: Payer: Self-pay

## 2020-10-25 DIAGNOSIS — Z20822 Contact with and (suspected) exposure to covid-19: Secondary | ICD-10-CM

## 2020-10-25 NOTE — Telephone Encounter (Signed)
Ronald Wiggins is requesting to fill pt xanax. Please advise [facility]

## 2020-10-26 ENCOUNTER — Other Ambulatory Visit: Payer: Self-pay | Admitting: Family Medicine

## 2020-10-26 DIAGNOSIS — E118 Type 2 diabetes mellitus with unspecified complications: Secondary | ICD-10-CM

## 2020-10-26 MED ORDER — METFORMIN HCL ER 500 MG PO TB24: ORAL_TABLET | ORAL | 0 refills | Status: DC

## 2020-10-26 NOTE — Telephone Encounter (Signed)
P.A. denied, pt needs trial of 3 formulary alternatives Citalopram, escitalopram, paroxetine, fluoxetine, sertraline, or Trintellix and pt has tried 2 of them Celexa & Trintellix.  Can pt try one of the others?

## 2020-10-27 LAB — SARS-COV-2, NAA 2 DAY TAT

## 2020-10-27 LAB — NOVEL CORONAVIRUS, NAA: SARS-CoV-2, NAA: NOT DETECTED

## 2020-10-27 NOTE — Telephone Encounter (Signed)
He also failed Lexapro

## 2020-10-28 NOTE — Telephone Encounter (Signed)
Resubmitted P.A. with Lexapro

## 2020-10-29 NOTE — Telephone Encounter (Signed)
P.A. now approved til 10/27/21, pt informed

## 2020-11-01 ENCOUNTER — Ambulatory Visit (INDEPENDENT_AMBULATORY_CARE_PROVIDER_SITE_OTHER): Payer: BC Managed Care – PPO | Admitting: Psychology

## 2020-11-01 DIAGNOSIS — F330 Major depressive disorder, recurrent, mild: Secondary | ICD-10-CM | POA: Diagnosis not present

## 2020-11-01 DIAGNOSIS — F411 Generalized anxiety disorder: Secondary | ICD-10-CM | POA: Diagnosis not present

## 2020-11-19 ENCOUNTER — Other Ambulatory Visit: Payer: Self-pay

## 2020-11-19 ENCOUNTER — Ambulatory Visit (INDEPENDENT_AMBULATORY_CARE_PROVIDER_SITE_OTHER): Payer: BC Managed Care – PPO | Admitting: Psychology

## 2020-11-19 ENCOUNTER — Telehealth: Payer: BC Managed Care – PPO | Admitting: Family Medicine

## 2020-11-19 VITALS — BP 116/72 | Wt 209.0 lb

## 2020-11-19 DIAGNOSIS — F32A Depression, unspecified: Secondary | ICD-10-CM | POA: Diagnosis not present

## 2020-11-19 DIAGNOSIS — F411 Generalized anxiety disorder: Secondary | ICD-10-CM

## 2020-11-19 DIAGNOSIS — F419 Anxiety disorder, unspecified: Secondary | ICD-10-CM

## 2020-11-19 DIAGNOSIS — F330 Major depressive disorder, recurrent, mild: Secondary | ICD-10-CM

## 2020-11-19 MED ORDER — CITALOPRAM HYDROBROMIDE 40 MG PO TABS: 40.0000 mg | ORAL_TABLET | Freq: Every day | ORAL | 0 refills | Status: DC

## 2020-11-19 NOTE — Progress Notes (Signed)
   Subjective:    Patient ID: Ronald Wiggins, male    DOB: 11/13/1975, 44 y.o.   MRN: [id number]  HPI I connected with  Ronald Wiggins on 11/19/20 by a video enabled telemedicine application and verified that I am speaking with the correct person using two identifiers.  Caregility used.  Me: Office.  Patient at home. I discussed the limitations of evaluation and management by telemedicine. The patient expressed understanding and agreed to proceed. This is a follow-up visit after switching him to a different medication however he states that he did not switch to the new medicine and just started feeling better on his own.  He states that he has less anxiety, depression symptoms as well as negative thoughts.  He feels as if the weight is been lifted off of his shoulders.  He states that since he was feeling better he saw no reason to switch to a new medication.  He continues in counseling with Mariane Masters through [facility] and is making good progress there.  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   Review of Systems     Objective:   Physical Exam  Alert and in no distress and noted to have a smile on his face.     Assessment & Plan:  Anxiety and depression - Plan: citalopram (CELEXA) 40 MG tablet Since he has done well sticking with the Celexa, we will continue with that and not take the new medication.  He will continue in counseling.  I congratulated him on finally turning the corner.  He will set up a virtual visit with me in about 3 months. 20 minutes spent reviewing medical record counseling and coordination of care.

## 2020-11-29 ENCOUNTER — Encounter: Payer: Self-pay | Admitting: Family Medicine

## 2021-01-13 ENCOUNTER — Other Ambulatory Visit: Payer: Self-pay | Admitting: Family Medicine

## 2021-01-13 DIAGNOSIS — E1169 Type 2 diabetes mellitus with other specified complication: Secondary | ICD-10-CM

## 2021-01-13 DIAGNOSIS — E785 Hyperlipidemia, unspecified: Secondary | ICD-10-CM

## 2021-01-13 MED ORDER — ATORVASTATIN CALCIUM 10 MG PO TABS: 10.0000 mg | ORAL_TABLET | Freq: Every day | ORAL | 0 refills | Status: DC

## 2021-01-31 DIAGNOSIS — F32A Depression, unspecified: Secondary | ICD-10-CM

## 2021-01-31 DIAGNOSIS — F419 Anxiety disorder, unspecified: Secondary | ICD-10-CM

## 2021-02-01 ENCOUNTER — Other Ambulatory Visit: Payer: Self-pay

## 2021-02-01 DIAGNOSIS — E1169 Type 2 diabetes mellitus with other specified complication: Secondary | ICD-10-CM

## 2021-02-01 DIAGNOSIS — E118 Type 2 diabetes mellitus with unspecified complications: Secondary | ICD-10-CM

## 2021-02-01 DIAGNOSIS — E785 Hyperlipidemia, unspecified: Secondary | ICD-10-CM

## 2021-02-01 MED ORDER — METFORMIN HCL ER 500 MG PO TB24: ORAL_TABLET | ORAL | 0 refills | Status: DC

## 2021-02-01 MED ORDER — CITALOPRAM HYDROBROMIDE 40 MG PO TABS: 40.0000 mg | ORAL_TABLET | Freq: Every day | ORAL | 0 refills | Status: DC

## 2021-02-01 MED ORDER — ATORVASTATIN CALCIUM 10 MG PO TABS: 10.0000 mg | ORAL_TABLET | Freq: Every day | ORAL | 0 refills | Status: DC

## 2021-03-08 ENCOUNTER — Telehealth: Payer: Self-pay

## 2021-03-08 DIAGNOSIS — F32A Depression, unspecified: Secondary | ICD-10-CM

## 2021-03-08 MED ORDER — CITALOPRAM HYDROBROMIDE 40 MG PO TABS
40.0000 mg | ORAL_TABLET | Freq: Every day | ORAL | 0 refills | Status: DC
Start: 2021-03-08 — End: 2021-03-17

## 2021-03-08 NOTE — Telephone Encounter (Signed)
Pt. Called stating he needed to schedule an apt. Because he needs his medications refilled. I got him scheduled for a med check on 03/15/21 but he wanted to know if you could send in a short term supply of his anti depressant to [facility]. Looks like he takes Citalopram.

## 2021-03-13 ENCOUNTER — Emergency Department (HOSPITAL_COMMUNITY)
Admission: EM | Admit: 2021-03-13 | Discharge: 2021-03-13 | Disposition: A | Discharge status: Home / Self Care | Payer: BLUE CROSS/BLUE SHIELD | Attending: Emergency Medicine | Admitting: Emergency Medicine

## 2021-03-13 ENCOUNTER — Encounter (HOSPITAL_COMMUNITY): Payer: Self-pay | Admitting: Emergency Medicine

## 2021-03-13 ENCOUNTER — Other Ambulatory Visit: Payer: Self-pay | Admitting: Family Medicine

## 2021-03-13 DIAGNOSIS — I10 Essential (primary) hypertension: Secondary | ICD-10-CM | POA: Insufficient documentation

## 2021-03-13 DIAGNOSIS — E118 Type 2 diabetes mellitus with unspecified complications: Secondary | ICD-10-CM

## 2021-03-13 DIAGNOSIS — Z7984 Long term (current) use of oral hypoglycemic drugs: Secondary | ICD-10-CM | POA: Insufficient documentation

## 2021-03-13 DIAGNOSIS — E785 Hyperlipidemia, unspecified: Secondary | ICD-10-CM | POA: Insufficient documentation

## 2021-03-13 DIAGNOSIS — Z87891 Personal history of nicotine dependence: Secondary | ICD-10-CM | POA: Insufficient documentation

## 2021-03-13 DIAGNOSIS — W548XXA Other contact with dog, initial encounter: Secondary | ICD-10-CM | POA: Insufficient documentation

## 2021-03-13 DIAGNOSIS — Z79899 Other long term (current) drug therapy: Secondary | ICD-10-CM | POA: Insufficient documentation

## 2021-03-13 DIAGNOSIS — E1169 Type 2 diabetes mellitus with other specified complication: Secondary | ICD-10-CM | POA: Insufficient documentation

## 2021-03-13 DIAGNOSIS — S0501XA Injury of conjunctiva and corneal abrasion without foreign body, right eye, initial encounter: Secondary | ICD-10-CM | POA: Insufficient documentation

## 2021-03-13 MED ORDER — FLUORESCEIN SODIUM 1 MG OP STRP
1.0000 | ORAL_STRIP | Freq: Once | OPHTHALMIC | Status: AC
  Administered 2021-03-13: 1 via OPHTHALMIC
  Filled 2021-03-13: qty 1

## 2021-03-13 MED ORDER — GATIFLOXACIN 0.5 % OP SOLN: 1.0000 [drp] | Freq: Four times a day (QID) | OPHTHALMIC | 0 refills | Status: DC

## 2021-03-13 MED ORDER — GATIFLOXACIN 0.5 % OP SOLN
1.0000 [drp] | Freq: Once | OPHTHALMIC | Status: AC
  Administered 2021-03-13: 1 [drp] via OPHTHALMIC
  Filled 2021-03-13: qty 2.5

## 2021-03-13 MED ORDER — ERYTHROMYCIN 5 MG/GM OP OINT: TOPICAL_OINTMENT | OPHTHALMIC | 0 refills | Status: DC

## 2021-03-13 MED ORDER — TETRACAINE HCL 0.5 % OP SOLN
2.0000 [drp] | Freq: Once | OPHTHALMIC | Status: AC
  Administered 2021-03-13: 2 [drp] via OPHTHALMIC
  Filled 2021-03-13: qty 4

## 2021-03-13 NOTE — ED Notes (Signed)
Meds and woods lamp at bedside.

## 2021-03-13 NOTE — ED Provider Notes (Addendum)
Emergency Medicine Provider Triage Evaluation Note  Ronald Wiggins , a 45 y.o. male  was evaluated in triage.  Pt complains of R eye injury.  Review of Systems  Positive: Eye pain, blurry vision Negative: Diplopia, loss of vision  Physical Exam  There were no vitals taken for this visit. Gen:   Awake, uncomfortable Resp:  Normal effort  MSK:   Moves extremities without difficulty  Other:  R eye: no obvious signs on injury noted, non injected.  EOMI, PERRL.   Medical Decision Making  Medically screening exam initiated at 3:50 PM.  Appropriate orders placed.  Ronald Wiggins was informed that the remainder of the evaluation will be completed by another provider, this initial triage assessment does not replace that evaluation, and the importance of remaining in the ED until their evaluation is complete.  Pt was playing with his dog when the dog accidentally scratched his R eye with its paws.  Pain and tear to R eye.  Not wearing contact lens or prescription glasses.  TDAP UTD.      Ronald Helper, PA-C 03/13/21 1553    Ronald Files, MD 03/14/21 [phone]

## 2021-03-13 NOTE — Discharge Instructions (Addendum)
You are seen in the emergency department for a scratch to your right eye.  You have a corneal abrasion.  Ophthalmology is recommending eyedrops and eye ointment.  Tylenol alternating with ibuprofen.  Follow-up in his office on Tuesday morning.  Call if worsening symptoms.  Avoid rubbing the eye.

## 2021-03-13 NOTE — ED Triage Notes (Signed)
Pt's dog scratched his R eye this morning.  States he is unable to open eye.

## 2021-03-13 NOTE — ED Provider Notes (Signed)
[facility] EMERGENCY DEPARTMENT Provider Note   CSN: [id number] Arrival date & time: 03/13/21  1534     History No chief complaint on file.   IVERSON SEES is a 45 y.o. male.  He is complaining of acute right eye pain after his dog scratched him in the eye while they were playing.  This occurred a few hours ago.  They tried to irrigate without any improvement.  Says he has a lot of pain when he opens his eyes and the vision is blurry.  Is also been watery.  He does not use glasses or have contact lenses.  He does not have an eye doctor.  No other injuries or complaints.  The history is provided by the patient.  Eye Problem Location:  Right eye Quality:  Tearing, stinging, stabbing, sharp, foreign body sensation and burning Severity:  Severe Onset quality:  Sudden Timing:  Constant Progression:  Unchanged Chronicity:  New Context: scratch   Relieved by:  Nothing Worsened by:  Bright light Ineffective treatments:  Sunglasses Associated symptoms: blurred vision, photophobia and tearing   Associated symptoms: no headaches, no nausea and no vomiting        Past Medical History:  Diagnosis Date  . Kidney stones     Patient Active Problem List   Diagnosis Date Noted  . Anxiety and depression 02/19/2020  . Hypertension associated with diabetes (HCC) 01/02/2020  . Hyperlipidemia associated with type 2 diabetes mellitus (HCC) 04/11/2016  . Type 2 diabetes with complication (HCC) 07/16/2014  . Obesity (BMI 30-39.9) 07/16/2014    History reviewed. No pertinent surgical history.     Family History  Problem Relation Age of Onset  . Urolithiasis Sister   . Urolithiasis Brother     Social History   Tobacco Use  . Smoking status: Former Smoker    Packs/day: 0.10    Types: Cigarettes  . Smokeless tobacco: Never Used  Substance Use Topics  . Alcohol use: Yes    Comment: occasionally  . Drug use: No    Home Medications Prior to Admission  medications   Medication Sig Start Date End Date Taking? Authorizing Provider  ALPRAZolam Prudy Feeler) 0.5 MG tablet TAKE ONE TABLET BY MOUTH EVERY NIGHT AT BEDTIME AS NEEDED FOR ANXIETY 10/25/20   Ronnald Nian, MD  atorvastatin (LIPITOR) 10 MG tablet Take 1 tablet (10 mg total) by mouth daily. 02/01/21   Ronnald Nian, MD  citalopram (CELEXA) 40 MG tablet Take 1 tablet (40 mg total) by mouth daily. 03/08/21   Ronnald Nian, MD  empagliflozin (JARDIANCE) 10 MG TABS tablet Take 10 mg by mouth daily before breakfast. Patient not taking: No sig reported 02/11/20   Ronnald Nian, MD  fluconazole (DIFLUCAN) 100 MG tablet Take 1 tablet (100 mg total) by mouth daily. Patient not taking: No sig reported 02/19/20   Ronnald Nian, MD  Glucose Blood (BLOOD GLUCOSE TEST STRIPS) STRP 1 each by Other route 2 (two) times daily. 01/07/20   Ronnald Nian, MD  lisinopril (ZESTRIL) 5 MG tablet TAKE 1 TABLET (5 MG) BY MOUTH DAILY 08/02/20   Ronnald Nian, MD  metFORMIN (GLUCOPHAGE-XR) 500 MG 24 hr tablet TAKE 2 TABLETS BY MOUTH DAILY AT BREAKFAST. 02/01/21   Ronnald Nian, MD  OneTouch Delica Lancets 30G MISC 1 each by Does not apply route 1 day or 1 dose. Use one daily to test blood sugar readings 01/07/20   Ronnald Nian, MD  Allergies    Patient has no known allergies.  Review of Systems   Review of Systems  Constitutional: Negative for fever.  Eyes: Positive for blurred vision, photophobia and visual disturbance.  Gastrointestinal: Negative for nausea and vomiting.  Neurological: Negative for headaches.    Physical Exam Updated Vital Signs BP (!) 153/87 (BP Location: Left Arm)   Pulse 76   Temp 98.4 F (36.9 C)   Resp 18   SpO2 96%   Physical Exam Vitals and nursing note reviewed.  Constitutional:      Appearance: He is well-developed.  HENT:     Head: Normocephalic and atraumatic.  Eyes:     General: Lids are normal.     Intraocular pressure: Right eye pressure is 13 mmHg.      Extraocular Movements: Extraocular movements intact.     Conjunctiva/sclera:     Right eye: Right conjunctiva is injected.     Pupils:     Right eye: Corneal abrasion and fluorescein uptake present.     Slit lamp exam:    Right eye: Anterior chamber quiet.     Comments: He has a fairly long corneal scratch through the visual access in his right eye on fluorescein.  No Seidel sign.  He had fairly good relief of his pain with topical tetracaine.  Pulmonary:     Effort: Pulmonary effort is normal.  Musculoskeletal:     Cervical back: Neck supple.  Skin:    General: Skin is warm and dry.  Neurological:     Mental Status: He is alert.     GCS: GCS eye subscore is 4. GCS verbal subscore is 5. GCS motor subscore is 6.     ED Results / Procedures / Treatments   Labs (all labs ordered are listed, but only abnormal results are displayed) Labs Reviewed - No data to display  EKG None  Radiology No results found.  Procedures Procedures   Medications Ordered in ED Medications  fluorescein ophthalmic strip 1 strip (has no administration in time range)  tetracaine (PONTOCAINE) 0.5 % ophthalmic solution 2 drop (has no administration in time range)    ED Course  I have reviewed the triage vital signs and the nursing notes.  Pertinent labs & imaging results that were available during my care of the patient were reviewed by me and considered in my medical decision making (see chart for details).  Clinical Course as of 03/13/21 1735  Sun Mar 13, 2021  1735 Discussed with Dr. Cathey Endow.  He is recommending no patching.  He recommends gatifloxacin 1 drop right eye 4 times daily.  Erythromycin ointment every 3 hours as needed for comfort.  Alternating Tylenol ibuprofen.  Follow-up in the office Tuesday morning. [MB]    Clinical Course User Index [MB] Terrilee Files, MD   MDM Rules/Calculators/A&P                          Differential includes corneal abrasion, open eye, hyphema,  traumatic iritis, foreign body.  Clinical exam shows corneal abrasion.  No evidence of globe rupture.  Intraocular pressure obtained and adequate.  Reviewed findings with ophthalmology on-call.  Recommended topical antibiotics and close outpatient follow-up.  Patient understanding of plan.  Return instructions discussed. Final Clinical Impression(s) / ED Diagnoses Final diagnoses:  Abrasion of right cornea, initial encounter    Rx / DC Orders ED Discharge Orders         Ordered  erythromycin ophthalmic ointment        03/13/21 1851    gatifloxacin (ZYMAXID) 0.5 % SOLN  4 times daily        03/13/21 1854           Terrilee Files, MD 03/13/21 2211

## 2021-03-15 ENCOUNTER — Encounter: Payer: BC Managed Care – PPO | Admitting: Family Medicine

## 2021-03-17 ENCOUNTER — Encounter: Payer: Self-pay | Admitting: Family Medicine

## 2021-03-17 ENCOUNTER — Ambulatory Visit (INDEPENDENT_AMBULATORY_CARE_PROVIDER_SITE_OTHER): Payer: Self-pay | Admitting: Family Medicine

## 2021-03-17 ENCOUNTER — Other Ambulatory Visit: Payer: Self-pay

## 2021-03-17 VITALS — BP 122/74 | HR 76 | Temp 98.6°F | Wt 216.4 lb

## 2021-03-17 DIAGNOSIS — F32A Depression, unspecified: Secondary | ICD-10-CM

## 2021-03-17 DIAGNOSIS — F419 Anxiety disorder, unspecified: Secondary | ICD-10-CM

## 2021-03-17 MED ORDER — CITALOPRAM HYDROBROMIDE 40 MG PO TABS: 40.0000 mg | ORAL_TABLET | Freq: Every day | ORAL | 1 refills | Status: DC

## 2021-03-17 NOTE — Progress Notes (Signed)
   Subjective:    Patient ID: Ronald Wiggins, male    DOB: 10/06/1976, 45 y.o.   MRN: [id number]  HPI He is here for a recheck.  He states that he is doing much better now and feels as if he is back to normal.  He states that he is much more mindful as to where he has psychologically.  Presently he is not involved in counseling but recognizes that that is an option that he will exercise if needed.   Review of Systems     Objective:   Physical Exam Alert and in no distress with appropriate affect and dressed appropriately.       Assessment & Plan:  Anxiety and depression

## 2021-08-14 ENCOUNTER — Other Ambulatory Visit: Payer: Self-pay | Admitting: Family Medicine

## 2021-08-14 DIAGNOSIS — E118 Type 2 diabetes mellitus with unspecified complications: Secondary | ICD-10-CM

## 2021-08-14 DIAGNOSIS — E1169 Type 2 diabetes mellitus with other specified complication: Secondary | ICD-10-CM

## 2021-08-15 NOTE — Telephone Encounter (Signed)
Spoke to pt about the need for an appt. Pt advised he just started a new job and will have coverage in 90 days. Pt med will be sent in for 30 days and pt will call back to schedule. He was advised of the discount as self pay. KH

## 2021-09-17 ENCOUNTER — Other Ambulatory Visit: Payer: Self-pay | Admitting: Family Medicine

## 2021-09-17 DIAGNOSIS — E118 Type 2 diabetes mellitus with unspecified complications: Secondary | ICD-10-CM

## 2021-09-17 DIAGNOSIS — E1169 Type 2 diabetes mellitus with other specified complication: Secondary | ICD-10-CM

## 2021-10-11 ENCOUNTER — Telehealth (INDEPENDENT_AMBULATORY_CARE_PROVIDER_SITE_OTHER): Payer: Self-pay | Admitting: Medical

## 2021-10-11 ENCOUNTER — Encounter: Payer: Self-pay | Admitting: Medical

## 2021-10-11 VITALS — Temp 97.2°F | Wt 225.0 lb

## 2021-10-11 DIAGNOSIS — J0110 Acute frontal sinusitis, unspecified: Secondary | ICD-10-CM

## 2021-10-11 MED ORDER — AMOXICILLIN 875 MG PO TABS: 875.0000 mg | ORAL_TABLET | Freq: Two times a day (BID) | ORAL | 0 refills | Status: AC

## 2021-10-11 NOTE — Progress Notes (Signed)
Subjective:     Patient ID: Ronald Wiggins, male   DOB: May 03, 1976, 45 y.o.   MRN: [id number]  This visit type was conducted due to national recommendations for restrictions regarding the COVID-19 Pandemic (e.g. social distancing) in an effort to limit this patient's exposure and mitigate transmission in our community.  Due to their co-morbid illnesses, this patient is at least at moderate risk for complications without adequate follow up.  This format is felt to be most appropriate for this patient at this time.    Documentation for virtual audio and video telecommunications through [facility] encounter:  The patient was located at home. The provider was located in the office. The patient did consent to this visit and is aware of possible charges through their insurance for this visit.  The other persons participating in this telemedicine service were none. Time spent on call was 20 minutes and in review of previous records 20 minutes total.  This virtual service is not related to other E/M service within previous 7 days.   HPI Chief Complaint  Patient presents with   Nasal Congestion    Sinus and chest congestion, H/A, fatigue started Monday last week. Took home test for Covid Tues and Wed   Virtual consult for illness.  He reports over a week history of sinus pressure, fatigue, headache, not feeling well.  Started with cold symptoms, but as the week went on continued with bad head congestion, fatigue, sinus pressure.   No fever, no body aches.   Had some chills last week.  No NVD.  No SOB or wheezing.  Chest feels a little tight but mostly in sinuses.   Having some post nasal drainage and blowing out yellowish mucous from nose.   Home covid test was negative on 2 different days.  Mom was around him and she had sinus infection.   Using mucinex OTC.   Doesn't do well with neti pot.  No other aggravating or relieving factors. No other complaint.   Current Outpatient Medications on File  Prior to Visit  Medication Sig Dispense Refill   ALPRAZolam (XANAX) 0.5 MG tablet TAKE ONE TABLET BY MOUTH EVERY NIGHT AT BEDTIME AS NEEDED FOR ANXIETY 30 tablet 1   atorvastatin (LIPITOR) 10 MG tablet TAKE ONE TABLET BY MOUTH DAILY 30 tablet 0   citalopram (CELEXA) 40 MG tablet Take 1 tablet (40 mg total) by mouth daily. 90 tablet 1   empagliflozin (JARDIANCE) 10 MG TABS tablet Take 10 mg by mouth daily before breakfast. 30 tablet 5   erythromycin ophthalmic ointment Place a 1/2 inch ribbon of ointment into the lower eyelid every 3 hours as needed for comfort. 3.5 g 0   fluconazole (DIFLUCAN) 100 MG tablet Take 1 tablet (100 mg total) by mouth daily. 7 tablet 0   gatifloxacin (ZYMAXID) 0.5 % SOLN Place 1 drop into the right eye 4 (four) times daily. Use it every three hours while awake for first 24 hours 2.5 mL 0   Glucose Blood (BLOOD GLUCOSE TEST STRIPS) STRP 1 each by Other route 2 (two) times daily. 200 strip 2   lisinopril (ZESTRIL) 5 MG tablet TAKE 1 TABLET (5 MG) BY MOUTH DAILY 30 tablet 0   metFORMIN (GLUCOPHAGE-XR) 500 MG 24 hr tablet TAKE TWO TABLETS BY MOUTH EVERY MORNING AT BREAKFAST 60 tablet 0   OneTouch Delica Lancets 30G MISC 1 each by Does not apply route 1 day or 1 dose. Use one daily to test blood sugar readings 200 each  2   No current facility-administered medications on file prior to visit.     Review of Systems As in subjective    Objective:   Physical Exam Due to coronavirus pandemic stay at home measures, patient visit was virtual and they were not examined in person.   Temp (!) 97.2 F (36.2 C) (Tympanic)    Wt 225 lb (102.1 kg)    BMI 35.24 kg/m       Assessment:     Encounter Diagnosis  Name Primary?   Acute non-recurrent frontal sinusitis Yes       Plan:     We discussed symptoms and concerns.  We discussed limitations of virtual consult.  Begin amoxicillin.  Can continue Mucinex or add antihistamine such as Benadryl for the next few days.  He  declines to do nasal saline.  Counseled on good water intake.  If not much improved within the next 3 to 4 days then call back.    Ronald Wiggins was seen today for nasal congestion.  Diagnoses and all orders for this visit:  Acute non-recurrent frontal sinusitis  Other orders -     amoxicillin (AMOXIL) 875 MG tablet; Take 1 tablet (875 mg total) by mouth 2 (two) times daily for 10 days.   F/u prn

## 2021-12-28 ENCOUNTER — Other Ambulatory Visit: Payer: Self-pay

## 2021-12-28 ENCOUNTER — Ambulatory Visit: Payer: Self-pay | Admitting: Family Medicine

## 2021-12-28 ENCOUNTER — Encounter: Payer: Self-pay | Admitting: Family Medicine

## 2021-12-28 VITALS — BP 138/76 | HR 71 | Temp 98.1°F | Ht 68.0 in | Wt 227.0 lb

## 2021-12-28 DIAGNOSIS — E118 Type 2 diabetes mellitus with unspecified complications: Secondary | ICD-10-CM

## 2021-12-28 DIAGNOSIS — E1169 Type 2 diabetes mellitus with other specified complication: Secondary | ICD-10-CM

## 2021-12-28 DIAGNOSIS — F32A Depression, unspecified: Secondary | ICD-10-CM

## 2021-12-28 MED ORDER — CITALOPRAM HYDROBROMIDE 40 MG PO TABS: 40.0000 mg | ORAL_TABLET | Freq: Every day | ORAL | 1 refills | Status: DC

## 2021-12-28 MED ORDER — ATORVASTATIN CALCIUM 10 MG PO TABS: 10.0000 mg | ORAL_TABLET | Freq: Every day | ORAL | 1 refills | Status: DC

## 2021-12-28 MED ORDER — BUPROPION HCL ER (SR) 150 MG PO TB12: 150.0000 mg | ORAL_TABLET | Freq: Every day | ORAL | 1 refills | Status: DC

## 2021-12-28 MED ORDER — METFORMIN HCL ER 500 MG PO TB24: ORAL_TABLET | ORAL | 1 refills | Status: DC

## 2021-12-28 MED ORDER — LISINOPRIL 5 MG PO TABS: ORAL_TABLET | ORAL | 1 refills | Status: DC

## 2021-12-28 NOTE — Progress Notes (Signed)
? ?  Subjective:  ? ? Patient ID: Ronald Wiggins, male    DOB: 1975-10-27, 46 y.o.   MRN: [id number] ? ?HPI ?He is here for follow-up visit.  He now is in the process of trying to find insurance.  He now has a new child at home and also has a 1 year old daughter there as well which is creating some increased stress.  He thinks that he needs to be on a stronger antidepressant.  He continues on his Lipitor, metformin, Celexa, lisinopril. ? ? ?Review of Systems ? ?   ?Objective:  ? Physical Exam ?Alert and in no distress otherwise not examined ? ? ? ?   ?Assessment & Plan:  ?Type 2 diabetes with complication (HCC) - Plan: POCT glycosylated hemoglobin (Hb A1C) ? ?Type 2 diabetes mellitus with complication, without long-term current use of insulin (HCC) - Plan: lisinopril (ZESTRIL) 5 MG tablet, metFORMIN (GLUCOPHAGE-XR) 500 MG 24 hr tablet ? ?Hyperlipidemia associated with type 2 diabetes mellitus (HCC) - Plan: atorvastatin (LIPITOR) 10 MG tablet ? ?Anxiety and depression - Plan: buPROPion (WELLBUTRIN SR) 150 MG 12 hr tablet, citalopram (CELEXA) 40 MG tablet ?After discussion with him I think it is appropriate to increase his psychotropic medication regimen and I will put him on bupropion as well as continue him on his Celexa.  Since he does not have insurance I will cover him until he can get insurance and come back in here for a more thorough exam and blood work.  He was comfortable with that and hoping that I would do it.  Once he gets insurance he will also get back into counseling as he recognizes the need to pursue this further since he is having difficulty dealing with the increased anxiety of his 10 year old daughter with her psychological issues as well as a newborn. ? ?

## 2022-01-18 ENCOUNTER — Telehealth: Payer: 59 | Admitting: Family Medicine

## 2022-01-18 ENCOUNTER — Encounter: Payer: Self-pay | Admitting: Family Medicine

## 2022-01-18 VITALS — Temp 95.9°F | Wt 227.0 lb

## 2022-01-18 DIAGNOSIS — J0100 Acute maxillary sinusitis, unspecified: Secondary | ICD-10-CM | POA: Diagnosis not present

## 2022-01-18 DIAGNOSIS — J301 Allergic rhinitis due to pollen: Secondary | ICD-10-CM | POA: Diagnosis not present

## 2022-01-18 MED ORDER — AMOXICILLIN-POT CLAVULANATE 875-125 MG PO TABS: 1.0000 | ORAL_TABLET | Freq: Two times a day (BID) | ORAL | 0 refills | Status: DC

## 2022-01-18 NOTE — Progress Notes (Signed)
? ?  Subjective:  ? ? Patient ID: Ronald Wiggins, male    DOB: 1976-06-23, 46 y.o.   MRN: [id number] ? ?HPI ?Documentation for virtual audio and video telecommunications through [facility] encounter: ?The patient was located at home. 2 patient identifiers used.  ?The provider was located in the office. ?The patient did consent to this visit and is aware of possible charges through their insurance for this visit. ?The other persons participating in this telemedicine service were none. ?Time spent on call was 5 minutes and in review of previous records >15 minutes total for counseling and coordination of care. ?This virtual service is not related to other E/M service within previous 7 days.  ?He has underlying allergies that started several weeks ago with sneezing, itchy watery eyes, rhinorrhea and last week he noted increased difficulty with sinus pressure, chest congestion, purulent postnasal drainage with some chills and now some right upper tooth discomfort.  He has been using Zyrtec.  He does not smoke. ? ?Review of Systems ? ?   ?Objective:  ? Physical Exam ?Alert and in no distress with a slightly nasal voice. ? ? ? ?   ?Assessment & Plan:  ?Acute non-recurrent maxillary sinusitis - Plan: amoxicillin-clavulanate (AUGMENTIN) 875-125 MG tablet ? ?Seasonal allergic rhinitis due to pollen ?Continue to use Zyrtec and add either Flonase or Rhinocort to the regimen.  Encouraged him to call me if not entirely better when he finishes the antibiotic. ? ?

## 2022-02-15 ENCOUNTER — Encounter: Payer: Self-pay | Admitting: Family Medicine

## 2022-06-21 ENCOUNTER — Encounter: Payer: Self-pay | Admitting: Internal Medicine

## 2022-07-25 ENCOUNTER — Encounter: Payer: Self-pay | Admitting: Internal Medicine

## 2022-07-26 ENCOUNTER — Telehealth (INDEPENDENT_AMBULATORY_CARE_PROVIDER_SITE_OTHER): Payer: 59 | Admitting: Family Medicine

## 2022-07-26 ENCOUNTER — Ambulatory Visit: Payer: 59 | Admitting: Family Medicine

## 2022-07-26 VITALS — Temp 97.0°F | Wt 227.0 lb

## 2022-07-26 DIAGNOSIS — U071 COVID-19: Secondary | ICD-10-CM

## 2022-07-26 NOTE — Progress Notes (Signed)
   Subjective:    Patient ID: Ronald Wiggins, male    DOB: Sep 07, 1976, 46 y.o.   MRN: [id number]  HPI Documentation for virtual audio and video telecommunications through [facility] encounter: The patient was located at home. 2 patient identifiers used.  The provider was located in the office. The patient did consent to this visit and is aware of possible charges through their insurance for this visit. The other persons participating in this telemedicine service were none. Time spent on call was 5 minutes and in review of previous records >15 minutes total for counseling and coordination of care. This virtual service is not related to other E/M service within previous 7 days.  He states that yesterday he developed chest congestion, slight coughing, nasal congestion with fatigue, myalgias and headache.  He tested himself this morning and was positive.  Review of Systems     Objective:   Physical Exam  Alert and in no distress with normal sounding voice.      Assessment & Plan:  COVID-19 I explained that he is at day 1 and he needs to treat himself symptomatically with Tylenol for fever aches and pains, cough medications as needed.  As long as over the next 5 days he slowly gets better, he is then able to use a mask for another 5 days after that.  If his symptoms worsen especially in regard to fever, cough and shortness of breath he is to call me.  He was comfortable with that.

## 2022-07-31 ENCOUNTER — Encounter: Payer: Self-pay | Admitting: Family Medicine

## 2022-08-07 ENCOUNTER — Encounter: Payer: Self-pay | Admitting: Internal Medicine

## 2022-08-19 ENCOUNTER — Other Ambulatory Visit: Payer: Self-pay | Admitting: Family Medicine

## 2022-08-19 DIAGNOSIS — E1169 Type 2 diabetes mellitus with other specified complication: Secondary | ICD-10-CM

## 2022-08-19 DIAGNOSIS — F419 Anxiety disorder, unspecified: Secondary | ICD-10-CM

## 2022-08-19 DIAGNOSIS — E118 Type 2 diabetes mellitus with unspecified complications: Secondary | ICD-10-CM

## 2022-08-21 NOTE — Telephone Encounter (Signed)
Refill request last apt 07/26/22

## 2022-08-25 ENCOUNTER — Other Ambulatory Visit: Payer: Self-pay | Admitting: Family Medicine

## 2022-08-25 DIAGNOSIS — F419 Anxiety disorder, unspecified: Secondary | ICD-10-CM

## 2022-08-25 NOTE — Telephone Encounter (Signed)
REFILL REQUEST LAST APT 07/26/22.

## 2022-09-01 NOTE — Telephone Encounter (Signed)
Called pt. LM to call back and schedule an apt.

## 2023-01-24 ENCOUNTER — Other Ambulatory Visit: Payer: Self-pay | Admitting: Family Medicine

## 2023-01-24 DIAGNOSIS — E118 Type 2 diabetes mellitus with unspecified complications: Secondary | ICD-10-CM

## 2023-04-25 ENCOUNTER — Other Ambulatory Visit: Payer: Self-pay | Admitting: Family Medicine

## 2023-04-25 DIAGNOSIS — E118 Type 2 diabetes mellitus with unspecified complications: Secondary | ICD-10-CM

## 2023-08-22 ENCOUNTER — Ambulatory Visit: Payer: 59 | Admitting: Family Medicine

## 2023-08-22 ENCOUNTER — Encounter: Payer: Self-pay | Admitting: Family Medicine

## 2023-08-22 VITALS — BP 130/88 | HR 78 | Temp 98.1°F | Wt 219.8 lb

## 2023-08-22 DIAGNOSIS — M25552 Pain in left hip: Secondary | ICD-10-CM | POA: Diagnosis not present

## 2023-08-22 NOTE — Patient Instructions (Signed)
Take 800 mg of ibuprofen 3 times per day for the next 7 to 10 days regularly.  You can do heat to your back for 20 minutes 3 times per day.  Keep me informed

## 2023-08-22 NOTE — Progress Notes (Signed)
   Subjective:    Patient ID: Ronald Wiggins, male    DOB: December 08, 1975, 48 y.o.   MRN: [id number]  HPI He is here for evaluation of left hip pain that started last night.  He states he has had this occasionally in the past but never to this extent.  He states that motion of the back causes a still hurt and has some radiation into the calf area.  No numbness tingling or weakness.   Review of Systems     Objective:    Physical Exam No tenderness over the lumbar spine with some pain on motion of the back.  No tenderness over SI joint.  Slight tenderness in the upper left lateral gluteal area.  Normal hip motion.  Normal DTRs and negative straight leg raising.       Assessment & Plan:  Left hip pain The pain is in the left upper gluteal area not over SI joint or lumbar area.  Normal hip motion.  Not clear what is causing this but will treat conservatively with NSAIDs and if continued difficulty further evaluation and treatment will be done.  He was comfortable with that.

## 2023-12-11 ENCOUNTER — Encounter: Payer: Self-pay | Admitting: Internal Medicine

## 2024-02-04 ENCOUNTER — Ambulatory Visit: Admitting: Medical

## 2024-02-04 VITALS — BP 110/68 | HR 95 | Ht 66.0 in | Wt 219.0 lb

## 2024-02-04 DIAGNOSIS — R631 Polydipsia: Secondary | ICD-10-CM

## 2024-02-04 DIAGNOSIS — E785 Hyperlipidemia, unspecified: Secondary | ICD-10-CM

## 2024-02-04 DIAGNOSIS — F32A Depression, unspecified: Secondary | ICD-10-CM

## 2024-02-04 DIAGNOSIS — E1169 Type 2 diabetes mellitus with other specified complication: Secondary | ICD-10-CM | POA: Diagnosis not present

## 2024-02-04 DIAGNOSIS — F419 Anxiety disorder, unspecified: Secondary | ICD-10-CM | POA: Diagnosis not present

## 2024-02-04 DIAGNOSIS — E118 Type 2 diabetes mellitus with unspecified complications: Secondary | ICD-10-CM | POA: Diagnosis not present

## 2024-02-04 LAB — POCT URINALYSIS DIP (PROADVANTAGE DEVICE)
Bilirubin, UA: NEGATIVE
Blood, UA: NEGATIVE
Glucose, UA: 500 mg/dL — AB
Ketones, POC UA: NEGATIVE mg/dL
Leukocytes, UA: NEGATIVE
Nitrite, UA: NEGATIVE
Protein Ur, POC: NEGATIVE mg/dL
Specific Gravity, Urine: 1.025
Urobilinogen, Ur: NEGATIVE
pH, UA: 6 (ref 5.0–8.0)

## 2024-02-04 LAB — POCT GLYCOSYLATED HEMOGLOBIN (HGB A1C): Hemoglobin A1C: 9 % — AB (ref 4.0–5.6)

## 2024-02-04 MED ORDER — JANUMET XR 100-1000 MG PO TB24: 1.0000 | ORAL_TABLET | Freq: Every day | ORAL | Status: DC

## 2024-02-04 NOTE — Progress Notes (Signed)
 Subjective:  Ronald Wiggins is a 48 y.o. male who presents for Chief Complaint  Patient presents with   Follow-up    Been off all Diabetic medications since last visit with Ronald Wiggins in 2023. Would like to get back on medications     Here for recheck on diabetes.  He normally sees Ronald Wiggins.  Last year at 1 point he was on metformin  but when he got his hemoglobin A1c under 6%, he came off medication and had been doing okay for a while.  However in recent weeks he feels like he probably needs to be back on medication.  He has had fatigue, polyuria, polydipsia.  No blurred vision.  He has a glucometer but has not been checking his sugars.  He has been eating unhealthy in general including sweets and junk food and excess carbohydrates.  He has no other symptoms of concern.  He is not fasting today.  He ate about 2 hours ago.  Prior was on cholesterol medication as well but he is no longer taking that either.  No other aggravating or relieving factors.    No other c/o.  Past Medical History:  Diagnosis Date   Kidney stones    Current Outpatient Medications on File Prior to Visit  Medication Sig Dispense Refill   ARIPiprazole (ABILIFY) 2 MG tablet Take 2 mg by mouth daily.     buPROPion  (WELLBUTRIN  SR) 150 MG 12 hr tablet TAKE ONE TABLET BY MOUTH DAILY 90 tablet 0   citalopram  (CELEXA ) 40 MG tablet TAKE ONE TABLET BY MOUTH DAILY 90 tablet 1   Glucose Blood (BLOOD GLUCOSE TEST STRIPS) STRP 1 each by Other route 2 (two) times daily. (Patient not taking: Reported on 02/04/2024) 200 strip 2   OneTouch Delica Lancets 30G MISC 1 each by Does not apply route 1 day or 1 dose. Use one daily to test blood sugar readings (Patient not taking: Reported on 02/04/2024) 200 each 2   No current facility-administered medications on file prior to visit.    The following portions of the patient's history were reviewed and updated as appropriate: allergies, current medications, past family history, past  medical history, past social history, past surgical history and problem list.  ROS Otherwise as in subjective above    Objective: BP 110/68   Pulse 95   Ht 5\' 6"  (1.676 m)   Wt 219 lb (99.3 kg)   BMI 35.35 kg/m   Wt Readings from Last 3 Encounters:  02/04/24 219 lb (99.3 kg)  08/22/23 219 lb 12.8 oz (99.7 kg)  07/26/22 227 lb (103 kg)    General appearance: alert, no distress, well developed, well nourished Psych: Pleasant, answers question appropriately    Assessment: Encounter Diagnoses  Name Primary?   Type 2 diabetes with complication (HCC) Yes   Polydipsia    Anxiety and depression    Hyperlipidemia associated with type 2 diabetes mellitus (HCC)      Plan: Hemoglobin A1c today over 9%.  Labs as below.  We discussed uncontrolled diabetes, possible complications.  We discussed prior medications for diabetes which was just metformin .  Begin samples of Janumet  XR 100/1000 mg, 1 tablet daily.  Advise he start using his glucometer and check blood sugars fasting in the morning.  Depending on labs we may need to even add short-term insulin but we will await labs tomorrow.  We spent a lot of time today talking about diet, exercise, compliance, follow-up.  Goal fasting glucose between 70-130  Adriane was seen today for follow-up.  Diagnoses and all orders for this visit:  Type 2 diabetes with complication (HCC) -     HgB A1c -     POCT Urinalysis DIP (Proadvantage Device) -     Basic metabolic panel with GFR -     CBC with Differential/Platelet  Polydipsia -     POCT Urinalysis DIP (Proadvantage Device) -     Basic metabolic panel with GFR -     CBC with Differential/Platelet  Anxiety and depression  Hyperlipidemia associated with type 2 diabetes mellitus (HCC)  Other orders -     SitaGLIPtin-MetFORMIN  HCl (JANUMET  XR) [phone] MG TB24; Take 1 tablet by mouth daily.    Follow up: Pending labs

## 2024-02-05 ENCOUNTER — Other Ambulatory Visit: Payer: Self-pay | Admitting: Medical

## 2024-02-05 LAB — CBC WITH DIFFERENTIAL/PLATELET
Basophils Absolute: 0.1 10*3/uL (ref 0.0–0.2)
Basos: 1 %
EOS (ABSOLUTE): 0.2 10*3/uL (ref 0.0–0.4)
Eos: 2 %
Hematocrit: 45.2 % (ref 37.5–51.0)
Hemoglobin: 15.5 g/dL (ref 13.0–17.7)
Immature Grans (Abs): 0 10*3/uL (ref 0.0–0.1)
Immature Granulocytes: 0 %
Lymphocytes Absolute: 2.8 10*3/uL (ref 0.7–3.1)
Lymphs: 37 %
MCH: 30.5 pg (ref 26.6–33.0)
MCHC: 34.3 g/dL (ref 31.5–35.7)
MCV: 89 fL (ref 79–97)
Monocytes Absolute: 0.4 10*3/uL (ref 0.1–0.9)
Monocytes: 5 %
Neutrophils Absolute: 4.1 10*3/uL (ref 1.4–7.0)
Neutrophils: 55 %
Platelets: 176 10*3/uL (ref 150–450)
RBC: 5.08 x10E6/uL (ref 4.14–5.80)
RDW: 12.3 % (ref 11.6–15.4)
WBC: 7.5 10*3/uL (ref 3.4–10.8)

## 2024-02-05 LAB — BASIC METABOLIC PANEL WITH GFR
BUN/Creatinine Ratio: 8 — ABNORMAL LOW (ref 9–20)
BUN: 6 mg/dL (ref 6–24)
CO2: 21 mmol/L (ref 20–29)
Calcium: 9.5 mg/dL (ref 8.7–10.2)
Chloride: 99 mmol/L (ref 96–106)
Creatinine, Ser: 0.72 mg/dL — ABNORMAL LOW (ref 0.76–1.27)
Glucose: 276 mg/dL — ABNORMAL HIGH (ref 70–99)
Potassium: 4.2 mmol/L (ref 3.5–5.2)
Sodium: 136 mmol/L (ref 134–144)
eGFR: 115 mL/min/{1.73_m2} (ref >59)

## 2024-02-05 MED ORDER — JANUMET XR 100-1000 MG PO TB24: 1.0000 | ORAL_TABLET | Freq: Every day | ORAL | 2 refills | Status: DC

## 2024-02-05 NOTE — Progress Notes (Signed)
 Results sent through MyChart

## 2024-02-08 ENCOUNTER — Other Ambulatory Visit (HOSPITAL_COMMUNITY): Payer: Self-pay

## 2024-02-08 ENCOUNTER — Telehealth: Payer: Self-pay

## 2024-02-08 NOTE — Telephone Encounter (Signed)
 Hello,             We have received a Prior Authorization request for Rx Janumet  XR 100-1000MG  er tablets. However the patients plan is asking if the patient has trialed (any) of the following listed before they will approved Janumet .  According to the patients chart I dont see any of the pref'd drugs trialed. Please advise

## 2024-02-11 NOTE — Telephone Encounter (Signed)
 Hi Ronald Wiggins, could you please sign off on rx in this encounter so I can close it.

## 2024-02-12 MED ORDER — JENTADUETO XR 5-1000 MG PO TB24: 1.0000 | ORAL_TABLET | Freq: Every day | ORAL | 1 refills | Status: DC

## 2024-02-12 NOTE — Telephone Encounter (Signed)
 The encounter just needed to be signed off on, I couldn't''sign'' off on it on my end, but now its signed off on, so nothing more is needed. Thank you

## 2024-06-23 ENCOUNTER — Ambulatory Visit: Admitting: Medical

## 2024-06-23 VITALS — BP 112/76 | HR 64 | Ht 66.0 in | Wt 225.8 lb

## 2024-06-23 DIAGNOSIS — E118 Type 2 diabetes mellitus with unspecified complications: Secondary | ICD-10-CM

## 2024-06-23 DIAGNOSIS — E669 Obesity, unspecified: Secondary | ICD-10-CM

## 2024-06-23 DIAGNOSIS — F419 Anxiety disorder, unspecified: Secondary | ICD-10-CM | POA: Diagnosis not present

## 2024-06-23 DIAGNOSIS — Z23 Encounter for immunization: Secondary | ICD-10-CM

## 2024-06-23 DIAGNOSIS — F32A Depression, unspecified: Secondary | ICD-10-CM

## 2024-06-23 DIAGNOSIS — E1169 Type 2 diabetes mellitus with other specified complication: Secondary | ICD-10-CM | POA: Diagnosis not present

## 2024-06-23 DIAGNOSIS — Z800 Family history of malignant neoplasm of digestive organs: Secondary | ICD-10-CM

## 2024-06-23 DIAGNOSIS — E785 Hyperlipidemia, unspecified: Secondary | ICD-10-CM

## 2024-06-23 DIAGNOSIS — Z1211 Encounter for screening for malignant neoplasm of colon: Secondary | ICD-10-CM

## 2024-06-23 NOTE — Progress Notes (Signed)
 Name: Ronald Wiggins   Date of Visit: 06/23/24   Date of last visit with me: 02/04/2024   CHIEF COMPLAINT:  Chief Complaint  Patient presents with   Follow-up    Follow-up on diabetes. No concerns, declines vaccinces today. Overdue for eye exam       HPI:  Discussed the use of AI scribe software for clinical note transcription with the patient, who gave verbal consent to proceed.  History of Present Illness   Ronald Wiggins is a 48 year old male who presents for a medication check.  He is currently taking Jentadueto  XR 02/999 mg once daily for diabetes management. Previously, he was on Janumet , but a change in insurance coverage necessitated the switch. Blood sugar levels have ranged from 120 to 180 mg/dL, although he has not checked them in the past couple of weeks. No chest pain or difficulty breathing. He has not had a cholesterol test in several years and is not currently on any cholesterol medication.  He is on Wellbutrin  XL 300 mg, Celexa  40 mg, and Abilify 10 mg daily for psychiatric management. He receives psychiatric care from a PA at [facility]. He experiences stress related to personal life, including housing and family responsibilities.  He reports limited physical activity, attributing it to stress. 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  He has a family history of ovarian and colon cancer. He has not undergone a colon cancer screening himself.  Past Medical History:  Diagnosis Date   Kidney stones    Current Outpatient Medications on File Prior to Visit  Medication Sig Dispense Refill   ARIPiprazole (ABILIFY) 10 MG tablet Take 10 mg by mouth every morning.     buPROPion  (WELLBUTRIN  XL) 300 MG 24 hr tablet Take 300 mg by mouth every morning.     citalopram  (CELEXA ) 40 MG tablet TAKE ONE TABLET BY  MOUTH DAILY 90 tablet 1   Glucose Blood (BLOOD GLUCOSE TEST STRIPS) STRP 1 each by Other route 2 (two) times daily. 200 strip 2   linaGLIPtin-metFORMIN  HCl ER (JENTADUETO  XR) 02-999 MG TB24 Take 1 tablet by mouth daily. 90 tablet 1   OneTouch Delica Lancets 30G MISC 1 each by Does not apply route 1 day or 1 dose. Use one daily to test blood sugar readings 200 each 2   No current facility-administered medications on file prior to visit.   ROS as in subjective   Objective: BP 112/76   Pulse 64   Ht 5' 6 (1.676 m)   Wt 225 lb 12.8 oz (102.4 kg)   SpO2 97%   BMI 36.45 kg/m   General appearence: alert, no distress, WD/WN,  Neck: supple, no lymphadenopathy, no thyromegaly, no masses Heart: RRR, normal S1, S2, no murmurs Lungs: CTA bilaterally, no wheezes, rhonchi, or ralesPulses: 2+ symmetric, upper and lower extremities, normal cap refill  Diabetic Foot Exam - Simple   Simple Foot Form Diabetic Foot exam was performed with the following findings: Yes 06/23/2024 12:40 PM  Visual Inspection No deformities, no ulcerations, no other skin breakdown bilaterally: Yes Sensation Testing Intact to touch and monofilament testing bilaterally: Yes Pulse Check Posterior Tibialis and Dorsalis pulse intact bilaterally: Yes Comments      Assessment: Encounter Diagnoses  Name Primary?   Type 2 diabetes with complication (HCC) Yes  Hyperlipidemia associated with type 2 diabetes mellitus (HCC)    Obesity (BMI 30-39.9)    Anxiety and depression    Family history of colon cancer    Screening for colon cancer    Need for influenza vaccination     Plan: Type 2 diabetes mellitus Blood sugar levels 120-180 mg/dL. Discussed GLP-1 receptor agonists for improved control and weight loss. No thyroid cancer family history. Emphasized exercise and diet. - Continue Jentadueto  XR 02/999 mg once daily. - Order hemoglobin A1c. - Recommend annual diabetic eye exam and dental check-up. - Check feet daily  for sores and wounds. - Encourage regular exercise most days. - consider GLP-1 receptor agonists .  He is interested in this.  Discussed possible risks. - Schedule follow-up twice a year.  Dyslipidemia (suspected, pending labs) Discussed standard care for diabetics to use cholesterol-lowering medication. - Order cholesterol test. - Consider statin therapy based on results.  General Health Maintenance Due for colon cancer screening due to family history and age. Discussed colonoscopy and general health maintenance. - Refer to gastroenterology for colonoscopy. - Administer flu vaccine today. - Discuss pneumonia and shingles vaccines. - Consider heart disease screening in future visits.    Jet was seen today for follow-up.  Diagnoses and all orders for this visit:  Type 2 diabetes with complication (HCC) -     Hemoglobin A1c -     Microalbumin/Creatinine Ratio, Urine -     Ambulatory referral to Ophthalmology  Hyperlipidemia associated with type 2 diabetes mellitus (HCC) -     Lipid panel  Obesity (BMI 30-39.9) -     TSH  Anxiety and depression  Family history of colon cancer -     Ambulatory referral to Gastroenterology  Screening for colon cancer -     Ambulatory referral to Gastroenterology  Need for influenza vaccination    F/u pending labs

## 2024-06-24 ENCOUNTER — Other Ambulatory Visit: Payer: Self-pay | Admitting: Medical

## 2024-06-24 ENCOUNTER — Ambulatory Visit: Payer: Self-pay | Admitting: Medical

## 2024-06-24 LAB — LIPID PANEL
Chol/HDL Ratio: 6.7 ratio — ABNORMAL HIGH (ref 0.0–5.0)
Cholesterol, Total: 221 mg/dL — ABNORMAL HIGH (ref 100–199)
HDL: 33 mg/dL — ABNORMAL LOW (ref >39)
LDL Chol Calc (NIH): 133 mg/dL — ABNORMAL HIGH (ref 0–99)
Triglycerides: 305 mg/dL — ABNORMAL HIGH (ref 0–149)
VLDL Cholesterol Cal: 55 mg/dL — ABNORMAL HIGH (ref 5–40)

## 2024-06-24 LAB — HEMOGLOBIN A1C
Est. average glucose Bld gHb Est-mCnc: 134 mg/dL
Hgb A1c MFr Bld: 6.3 % — ABNORMAL HIGH (ref 4.8–5.6)

## 2024-06-24 LAB — TSH: TSH: 2.08 u[IU]/mL (ref 0.450–4.500)

## 2024-06-24 MED ORDER — MOUNJARO 5 MG/0.5ML ~~LOC~~ SOAJ
5.0000 mg | SUBCUTANEOUS | 0 refills | Status: DC
Start: 2024-06-24 — End: 2024-07-02

## 2024-06-24 MED ORDER — JENTADUETO XR 5-1000 MG PO TB24
1.0000 | ORAL_TABLET | Freq: Every day | ORAL | 0 refills | Status: DC
Start: 2024-06-24 — End: 2024-07-02

## 2024-06-24 MED ORDER — TIRZEPATIDE 2.5 MG/0.5ML ~~LOC~~ SOAJ: 2.5000 mg | SUBCUTANEOUS | 0 refills | Status: DC

## 2024-06-24 MED ORDER — ROSUVASTATIN CALCIUM 20 MG PO TABS: 20.0000 mg | ORAL_TABLET | Freq: Every day | ORAL | 2 refills | Status: DC

## 2024-06-24 MED ORDER — TIRZEPATIDE 10 MG/0.5ML ~~LOC~~ SOAJ: 10.0000 mg | SUBCUTANEOUS | 0 refills | Status: DC

## 2024-06-24 MED ORDER — TIRZEPATIDE 7.5 MG/0.5ML ~~LOC~~ SOAJ: 7.5000 mg | SUBCUTANEOUS | 0 refills | Status: DC

## 2024-06-24 NOTE — Progress Notes (Signed)
 Results to MyChart

## 2024-06-25 LAB — MICROALBUMIN / CREATININE URINE RATIO
Creatinine, Urine: 134.5 mg/dL
Microalb/Creat Ratio: 5 mg/g{creat} (ref 0–29)
Microalbumin, Urine: 6.4 ug/mL

## 2024-06-25 NOTE — Progress Notes (Signed)
 Results through MyChart

## 2024-07-02 MED ORDER — JENTADUETO XR 5-1000 MG PO TB24: 1.0000 | ORAL_TABLET | Freq: Every day | ORAL | 0 refills | Status: DC

## 2024-07-02 MED ORDER — TIRZEPATIDE 2.5 MG/0.5ML ~~LOC~~ SOAJ: 2.5000 mg | SUBCUTANEOUS | 0 refills | Status: AC

## 2024-07-02 MED ORDER — MOUNJARO 5 MG/0.5ML ~~LOC~~ SOAJ: 5.0000 mg | SUBCUTANEOUS | 0 refills | Status: AC

## 2024-07-02 MED ORDER — TIRZEPATIDE 7.5 MG/0.5ML ~~LOC~~ SOAJ: 7.5000 mg | SUBCUTANEOUS | 0 refills | Status: AC

## 2024-07-02 MED ORDER — TIRZEPATIDE 10 MG/0.5ML ~~LOC~~ SOAJ: 10.0000 mg | SUBCUTANEOUS | 0 refills | Status: AC

## 2024-09-02 ENCOUNTER — Other Ambulatory Visit: Payer: Self-pay | Admitting: Family Medicine

## 2024-09-24 ENCOUNTER — Other Ambulatory Visit: Payer: Self-pay | Admitting: Medical

## 2024-10-11 ENCOUNTER — Other Ambulatory Visit: Payer: Self-pay | Admitting: Family Medicine

## 2024-10-13 NOTE — Telephone Encounter (Signed)
 Pt has an appt in January 2026

## 2024-10-14 ENCOUNTER — Ambulatory Visit (HOSPITAL_COMMUNITY)
Admission: EM | Admit: 2024-10-14 | Discharge: 2024-10-14 | Disposition: A | Discharge status: Home / Self Care | Source: Home / Self Care

## 2024-10-14 DIAGNOSIS — F909 Attention-deficit hyperactivity disorder, unspecified type: Secondary | ICD-10-CM

## 2024-10-14 DIAGNOSIS — F332 Major depressive disorder, recurrent severe without psychotic features: Secondary | ICD-10-CM

## 2024-10-14 NOTE — Progress Notes (Signed)
" °   10/14/24 1059  [facility] Triage Screening (Walk-ins at [facility] only)  What Is the Reason for Your Visit/Call Today? Ronald Wiggins [phone] male presents to [facility] accompanied by his wife, voluntarily. PT shares that he has been experiencing worsening depression. PT states he is diagnosed w/ depression and anxiety; medications are taken regularly. PT states he doesn't know what he feels would help him he just wants to talk to someone. PT denies SI, HI, AVH and alcohol use.  How Long Has This Been Causing You Problems? 1 wk - 1 month  Have You Recently Had Any Thoughts About Hurting Yourself? No  Are You Planning to Commit Suicide/Harm Yourself At This time? No  Have you Recently Had Thoughts About Hurting Someone [place]? No  Are You Planning To Harm Someone At This Time? No  Physical Abuse Denies  Verbal Abuse Denies  Sexual Abuse Denies  Exploitation of patient/patient's resources Denies  Self-Neglect Denies  Are you currently experiencing any auditory, visual or other hallucinations? No  Have You Used Any Alcohol or Drugs in the Past 24 Hours? Yes  What Did You Use and How Much? cannabis (smoked 1 joint)  Do you have any current medical co-morbidities that require immediate attention? No  Clinician description of patient physical appearance/behavior: sad, cooperative, dressed casually  What Do You Feel Would Help You the Most Today? Treatment for Depression or other mood problem (just to talk to someone)  Determination of Need Routine (7 days)  Options For Referral Outpatient Therapy    "

## 2024-10-14 NOTE — ED Notes (Signed)
 Pt escorted out by Starlyn , NP.

## 2024-10-14 NOTE — Discharge Instructions (Signed)

## 2024-10-14 NOTE — ED Provider Notes (Signed)
 [facility]al [facility] Medical Screening Exam  Patient Name: Ronald Wiggins MRN: [id number] Date of Evaluation: 10/14/2024 Chief Complaint:  I guess I needed to talk to someone about my depression Diagnosis:  Final diagnoses:  Severe episode of recurrent major depressive disorder, without psychotic features (HCC)  Attention deficit hyperactivity disorder (ADHD), unspecified ADHD type    History of Present illness: Ronald Wiggins is a 48 y.o. male.  Patient presents to the [facility] voluntarily as a walk-in accompanied by his wife Chastin Riesgo [phone] with a chief complaint of increasing depression and anxiety.   Patient reports that fer the last month, he has been feeling increasingly depressed and overwhelmed, and experiencing difficulty keeping up with work. Reports he was diagnosed with depression and anxiety at age 14. He also shares that he has ADHD with a hx of taking medications. The last ADHD medication he took was Strattera, which he stopped about a year ago because it was making me too sleepy.  He reports that current psychiatric medications are :  Citalopram  40 mg PO Daily Abilify 10 mg PO daily Auvelity  60 mg PO daily.   He reports that he recently switched from Abilify to [place]. Reports his medications are prescribed from [facility]. His next appointment is 10/20/2024.   Patient gives permission to talk to his wife for collateral information: Wife reports no specific stressors. She reports that she did not know that her husband had stopped taking ADHD medications. She does admit that he was sleeping too much, especially on his days off. Wife states she will encourage him to see his provider sooner to address ADHD medication needs. Wife reports no issues at home.   Assessment:   On approach, the patient is alert and oriented x 4. Patient answers questions  appropriately. Patient thought process is logical, speech coherent and tone is normal. He maintains eye contact throughout this assessment.  He is casually dressed. Appears healthy and well nourished.  Patient denies suicidal thoughts. Denies hx of self-harm behaviors. Patient denies homicidal ideations. He  denies AVH. Patient does not appear to be responding to internal, or external stimuli.   Patient admits to using Marijuana on a daily basis it helps me relax. He reports that he drinks alcohol seldomly. No additional drug use.   Patient endorses depressive symptoms of hopelessness,  lack of interest or pleasure in activities, increased sleep , poor performance at work,  decreased energy, Low self-esteem and crying spells. Patient shares that today, he had to leave work early because I couldn't function, every day is struggle, I break down, I beak down, and lose focus. He reports that his job is not very hard. Reports getting along with coworkers.   Patient denies a hx of abuse.  He does report a family hx of depression and anxiety on his father's side.  He reports that his appetite is normal and  he sleeps at least 8 hours a night. He denies physical pain. Denies nausea/vomiting. Denies respiratory distress. Denies chest/back/abdominal pain.   Patient reports that he is willing to call his provider for a sooner appointment to discuss about ADHD medications, willing to try something that will not make him too sleepy. Reports he came here to just to talk and see if I can feel better. Reports no safety issues at home, that he feels supported by his wife and extended families.   Patient is encouraged to contact his outpatient  provider for a sooner appointment to discuss his medication needs. Emotional support and additional resources  provided.      Flowsheet Row ED from 10/14/2024 in [facility] Office Visit from 08/25/2020 in [facility]  C-SSRS RISK  CATEGORY Low Risk Low Risk    Psychiatric Specialty Exam  Presentation  General Appearance:Appropriate for Environment  Eye Contact:Fair  Speech:Clear and Coherent  Speech Volume:Normal  Handedness:Right   Mood and Affect  Mood: Anxious; Depressed  Affect: Tearful   Thought Process  Thought Processes: Coherent  Descriptions of Associations:Intact  Orientation:Full (Time, Place and Person)  Thought Content:WDL    Hallucinations:None  Ideas of Reference:None  Suicidal Thoughts:No  Homicidal Thoughts:No   Sensorium  Memory: Remote Fair; Recent Fair; Immediate Fair  Judgment: Fair  Insight: Fair   Art Therapist  Concentration: Fair  Attention Span: Fair  Recall: [employer] of Knowledge: Fair  Language: Fair   Psychomotor Activity  Psychomotor Activity: Normal   Assets  Assets: Manufacturing Systems Engineer; Desire for Improvement; Financial Resources/Insurance; Housing; Social Support   Sleep  Sleep: Good  Number of hours:  8   Physical Exam: Physical Exam Vitals and nursing note reviewed.  Constitutional:      Appearance: Normal appearance.  HENT:     Head: Normocephalic and atraumatic.     Right Ear: Tympanic membrane normal.     Left Ear: Tympanic membrane normal.     Nose: Nose normal.  Cardiovascular:     Rate and Rhythm: Normal rate.     Pulses: Normal pulses.  Pulmonary:     Effort: Pulmonary effort is normal.  Musculoskeletal:        General: Normal range of motion.     Cervical back: Normal range of motion and neck supple.  Neurological:     General: No focal deficit present.     Mental Status: He is alert and oriented to person, place, and time.  Psychiatric:        Thought Content: Thought content normal.    Review of Systems  Constitutional: Negative.   HENT: Negative.    Eyes: Negative.   Respiratory: Negative.    Cardiovascular: Negative.   Gastrointestinal: Negative.   Genitourinary:  Negative.   Musculoskeletal: Negative.   Skin: Negative.   Neurological: Negative.   Endo/Heme/Allergies: Negative.   Psychiatric/Behavioral:  Positive for depression. The patient is nervous/anxious.    Blood pressure 138/87, pulse 73, temperature 98.4 F (36.9 C), temperature source Oral, resp. rate 17, SpO2 100%. There is no height or weight on file to calculate BMI.  Musculoskeletal: Strength & Muscle Tone: within normal limits Gait & Station: normal Patient leans: N/A   [facility] MSE Discharge Disposition for Follow up and Recommendations: Based on my evaluation the patient does not appear to have an emergency medical condition and can be discharged with resources and follow up care in outpatient services for Medication Management, Individual Therapy, and Group Therapy   Randall Bouquet, NP 10/14/2024, 11:47 AM

## 2024-10-17 ENCOUNTER — Other Ambulatory Visit: Payer: Self-pay | Admitting: Family Medicine

## 2024-10-27 ENCOUNTER — Encounter: Payer: Self-pay | Admitting: Medical

## 2024-10-27 ENCOUNTER — Ambulatory Visit: Payer: Self-pay | Admitting: Medical

## 2024-10-27 VITALS — BP 118/76 | HR 68 | Wt 213.0 lb

## 2024-10-27 DIAGNOSIS — E1169 Type 2 diabetes mellitus with other specified complication: Secondary | ICD-10-CM | POA: Diagnosis not present

## 2024-10-27 DIAGNOSIS — F32A Depression, unspecified: Secondary | ICD-10-CM

## 2024-10-27 DIAGNOSIS — Z1211 Encounter for screening for malignant neoplasm of colon: Secondary | ICD-10-CM | POA: Diagnosis not present

## 2024-10-27 DIAGNOSIS — Z7185 Encounter for immunization safety counseling: Secondary | ICD-10-CM

## 2024-10-27 DIAGNOSIS — E118 Type 2 diabetes mellitus with unspecified complications: Secondary | ICD-10-CM

## 2024-10-27 DIAGNOSIS — F419 Anxiety disorder, unspecified: Secondary | ICD-10-CM

## 2024-10-27 DIAGNOSIS — E785 Hyperlipidemia, unspecified: Secondary | ICD-10-CM

## 2024-10-27 MED ORDER — ROSUVASTATIN CALCIUM 20 MG PO TABS: 20.0000 mg | ORAL_TABLET | Freq: Every day | ORAL | 1 refills | Status: AC

## 2024-10-27 MED ORDER — TRULICITY 0.75 MG/0.5ML ~~LOC~~ SOAJ: 0.7500 mg | SUBCUTANEOUS | 0 refills | Status: AC

## 2024-10-27 MED ORDER — JENTADUETO XR 5-1000 MG PO TB24: 1.0000 | ORAL_TABLET | Freq: Every day | ORAL | 1 refills | Status: AC

## 2024-10-27 NOTE — Patient Instructions (Addendum)
 Ophthalmologists [facility] [address] [place], [place], [place] [address] [phone]  [facility] [address], [place], [place] [address] [phone]  [facility] [address] [place], [place] [address] [phone]  [facility]  [address] [place], [place], [place] [address] [phone]  [facility] [address], [place], [place] [address] [phone]    Schedule physical fasting in the coming months

## 2024-10-27 NOTE — Progress Notes (Signed)
 "  Name: Ronald Wiggins   Date of Visit: 10/27/2024   Date of last visit with me: 09/24/2024   CHIEF COMPLAINT:  Chief Complaint  Patient presents with   Medical Management of Chronic Issues    Med check,        HPI:  Discussed the use of AI scribe software for clinical note transcription with the patient, who gave verbal consent to proceed.  History of Present Illness  Ronald Wiggins is a 49 year old male with diabetes and hyperlipidemia who presents for a medication check.  He was previously recommended to start Mounjaro  last visit, a weekly injection for diabetes, but did not begin it due to issues with insurance authorization. He is currently taking Jentadueto  XR 02/999 mg once daily for diabetes and Crestor  20 mg for cholesterol. He does not check his blood sugars regularly.  He works in engineering geologist and engages in daily physical activity, walking about a mile each day.  He has not had a recent colon cancer screening but believes there is a family history of colon cancer.  His is compliant with his antidepressants.  He receives psychiatric care from [facility] but does not see a veterinary surgeon.  He had a hospital visit in December regarding depression, and has followed up since then. He has not seen an eye doctor regularly and does not have one currently.  No specific concerns at this time.   Past Medical History:  Diagnosis Date   Kidney stones    Medications Ordered Prior to Encounter[1]   ROS as in subjective    Objective: BP 118/76   Pulse 68   Wt 213 lb (96.6 kg)   BMI 34.38 kg/m   General appearence: alert, no distress, WD/WN,  Heart: RRR, normal S1, S2, no murmurs Lungs: CTA bilaterally, no wheezes, rhonchi, or rales Psychiatric: somewhat flat affect, pleasant      Assessment: Encounter Diagnoses  Name Primary?   Type 2 diabetes with complication (HCC) Yes   Screening for colon cancer    Vaccine counseling    Hyperlipidemia associated with  type 2 diabetes mellitus (HCC)    Anxiety and depression    Morbid obesity (HCC)     Plan: Type 2 diabetes mellitus Well-controlled with Jentadueto  XR. Trulicity  considered for enhanced glycemic control and weight loss. - Continue Jentadueto  XR 02/999 mg daily. - Prescribed Trulicity , starting with the lowest dose. (Mounjaro  apparently not approved by insurance last visit) - Monitor blood sugar levels, especially when increasing Trulicity  dose. - Encouraged regular exercise and dietary modifications. - call in follow-up in 30 days to assess Trulicity  tolerance and adjust dosage.  Hyperlipidemia Previously elevated cholesterol levels. Nonfasting today - Order fasting lipid panel at next visit. - Continue Crestor  20 mg daily.  Morbid obesity Management includes lifestyle modifications and potential pharmacotherapy. Trulicity  may aid in weight loss. - Encouraged increased physical activity, aiming for 8,000 steps or 2-3 miles daily. - Promoted dietary changes focusing on whole grains, fruits, and vegetables while limiting carbohydrates. - Initiated Trulicity  for potential weight loss benefits.  General health maintenance Discussed importance of regular screenings and vaccinations. Colonoscopy recommended due to family history of colon cancer. - Referred to gastroenterology for colonoscopy. - Provided list of ophthalmologists for diabetic eye exams. - consider pneumonia vaccine  - Scheduled regular physical exam with fasting labs in 20-90 days.   Ronald Wiggins was seen today for medical management of chronic issues.  Diagnoses and all orders for this visit:  Type 2  diabetes with complication (HCC)  Screening for colon cancer -     Ambulatory referral to Gastroenterology  Vaccine counseling  Hyperlipidemia associated with type 2 diabetes mellitus (HCC)  Anxiety and depression  Morbid obesity (HCC)  Other orders -     linaGLIPtin-metFORMIN  HCl ER (JENTADUETO  XR) 02-999 MG TB24;  Take 1 tablet by mouth daily. -     rosuvastatin  (CRESTOR ) 20 MG tablet; Take 1 tablet (20 mg total) by mouth daily. -     Dulaglutide  (TRULICITY ) 0.75 MG/0.5ML SOAJ; Inject 0.75 mg into the skin once a week.    F/u 12mo for fasting well visit     [1]  Current Outpatient Medications on File Prior to Visit  Medication Sig Dispense Refill   AUVELITY 45-105 MG TBCR Take 1 tablet by mouth 2 (two) times daily.     citalopram  (CELEXA ) 40 MG tablet TAKE ONE TABLET BY MOUTH DAILY 90 tablet 1   Glucose Blood (BLOOD GLUCOSE TEST STRIPS) STRP 1 each by Other route 2 (two) times daily. 200 strip 2   OneTouch Delica Lancets 30G MISC 1 each by Does not apply route 1 day or 1 dose. Use one daily to test blood sugar readings 200 each 2   REXULTI 1 MG TABS tablet Take 1 mg by mouth daily.     ARIPiprazole (ABILIFY) 10 MG tablet Take 10 mg by mouth every morning. (Patient not taking: Reported on 10/27/2024)     buPROPion  (WELLBUTRIN  XL) 300 MG 24 hr tablet Take 300 mg by mouth every morning. (Patient not taking: Reported on 10/27/2024)     tirzepatide  (MOUNJARO ) 10 MG/0.5ML Pen Inject 10 mg into the skin once a week. (Patient not taking: Reported on 10/27/2024) 6 mL 0   tirzepatide  (MOUNJARO ) 2.5 MG/0.5ML Pen Inject 2.5 mg into the skin once a week. (Patient not taking: Reported on 10/27/2024) 2 mL 0   tirzepatide  (MOUNJARO ) 5 MG/0.5ML Pen Inject 5 mg into the skin once a week. (Patient not taking: Reported on 10/27/2024) 2 mL 0   tirzepatide  (MOUNJARO ) 7.5 MG/0.5ML Pen Inject 7.5 mg into the skin once a week. (Patient not taking: Reported on 10/27/2024) 6 mL 0   No current facility-administered medications on file prior to visit.   "

## 2025-02-02 ENCOUNTER — Encounter: Admitting: Medical
